# Patient Record
Sex: Female | Born: 1995 | Race: Black or African American | Hispanic: No | Marital: Single | Smoking: Never smoker
Health system: Southern US, Community
[De-identification: ages and names within clinical notes are randomized; demographics above are authoritative.]

## PROBLEM LIST (undated history)

## (undated) DIAGNOSIS — Z789 Other specified health status: Secondary | ICD-10-CM

## (undated) DIAGNOSIS — D649 Anemia, unspecified: Secondary | ICD-10-CM

## (undated) HISTORY — PX: NO PAST SURGERIES: SHX2092

## (undated) NOTE — *Deleted (*Deleted)
Meet the Provider Zoom Sessions      [facility] for [facility] is now offering FREE monthly 1-hour virtual Zoom sessions for new, current, and prospective patients.        During these sessions, you can:   Learn about our practice, model of care, services   Get answers to questions about pregnancy and birth during COVID   Pick your provider's brain about anything else!    Sessions will be hosted by [employer] for [employer], Producer, television/film/video, Physicians and Midwives          No registration required      2021 Dates:      All at 6pm     October 21st     November 18th   December 16th     January 20th  February 17th    To join one of these meetings, a few minutes before it is set to start:     Copy/paste the link into your web browser:  [email or web address]    OR  Scan the QR code below (open up your camera and point towards QR code; click on tab that pops up on your phone ("zoom")     Pregnancy and Anemia  Anemia is a condition in which the concentration of red blood cells, or hemoglobin, in the blood is below normal. Hemoglobin is a substance in red blood cells that carries oxygen to the tissues of the body. Anemia results when enough oxygen does not reach these tissues. Anemia is common during pregnancy because the woman's body needs more blood volume and blood cells to provide nutrition to the fetus. The fetus needs iron and folic acid as it is developing. Your body may not produce enough red blood cells because of this. Also, during pregnancy, the liquid part of the blood (plasma) increases by about 30-50%, and the red blood cells increase by only 20%. This lowers the concentration of the red blood cells and creates a natural anemia-like situation. What are the causes? The most common cause of anemia during pregnancy is not having enough iron in the body to make red  blood cells (iron deficiency anemia). Other causes may include:  Folic acid deficiency.  Vitamin B12 deficiency.  Certain prescription or over-the-counter medicines.  Certain medical conditions or infections that destroy red blood cells.  A low platelet count and bleeding caused by antibodies that go through the placenta to the fetus from the mother's blood. What are the signs or symptoms? Mild anemia may not be noticeable. If it becomes severe, symptoms may include:  Feeling tired (fatigue).  Shortness of breath, especially during activity.  Weakness.  Fainting.  Pale looking skin.  Headaches.  A fast or irregular heartbeat (palpitations).  Dizziness. How is this diagnosed? This condition may be diagnosed based on:  Your medical history and a physical exam.  Blood tests. How is this treated? Treatment for anemia during pregnancy depends on the cause of the anemia. Treatment can include:  Dietary changes.  Supplements of iron, vitamin B12, or folic acid.  A blood transfusion. This may be needed if anemia is severe.  Hospitalization. This may be needed if there is a lot of blood loss or severe anemia. Follow these instructions at home:  Follow recommendations from your dietitian or health care provider about changing your diet.  Increase your vitamin C intake. This will help the stomach absorb more iron. Some foods that are high in vitamin C include: ? Oranges. ? Peppers. ?  Tomatoes. ? Mangoes.  Eat a diet rich in iron. This would include foods such as: ? Liver. ? Beef. ? Eggs. ? Whole grains. ? Spinach. ? Dried fruit.  Take iron and vitamins as told by your health care provider.  Eat green leafy vegetables. These are a good source of folic acid.  Keep all follow-up visits as told by your health care provider. This is important. Contact a health care provider if:  You have frequent or lasting headaches.  You look pale.  You bruise easily. Get  help right away if:  You have extreme weakness, shortness of breath, or chest pain.  You become dizzy or have trouble concentrating.  You have heavy vaginal bleeding.  You develop a rash.  You have bloody or black, tarry stools.  You faint.  You vomit up blood.  You vomit repeatedly.  You have abdominal pain.  You have a fever.  You are dehydrated. Summary  Anemia is a condition in which the concentration of red blood cells or hemoglobin in the blood is below normal.  Anemia is common during pregnancy because the woman's body needs more blood volume and blood cells to provide nutrition to the fetus.  The most common cause of anemia during pregnancy is not having enough iron in the body to make red blood cells (iron deficiency anemia).  Mild anemia may not be noticeable. If it becomes severe, symptoms may include feeling tired and weak. This information is not intended to replace advice given to you by your health care provider. Make sure you discuss any questions you have with your health care provider. Document Revised: 12/10/2018 Document Reviewed: 08/01/2016 Elsevier Patient Education  2020 [employer].

---

## 2018-07-10 NOTE — L&D Delivery Note (Signed)
Patient is a 23 y.o. now G1P1 s/p NSVD at [redacted]w[redacted]d, who was admitted for IOL for PD.  She progressed with augmentation (AROM,Oxytocin) to complete and pushed ~ 2hours to deliver. Head delivered (ROA) then shoulder dystocia noted. McRoberts and Suprapubic performed to deliver shoulders. Bruising noted on left shoulder after delivery. Spontaneous cry after stimulation.  Cord clamping delayed by several minutes then clamped by CNM and cut by FOB.  Placenta intact and spontaneous, bleeding minimal.  Left labial laceration repaired without difficulty.  Mom and baby stable prior to transfer to postpartum. She plans on breastfeeding. She declines method of birth control.  Delivery Note At 4:28 PM a viable and healthy female was delivered via Vaginal, Spontaneous (Presentation: ROA).  APGAR: 7, 9; weight pending .   Placenta intact and spontaneous, bleeding minimal. 3V Cord:  with the following complications: shoulder dystocia.  Cord pH: collected but could not be ran d/t clotting.   Anesthesia:  Epidural Episiotomy: None Lacerations: Labial Suture Repair: 3.0 vicryl Est. Blood Loss (mL):  537mL   Mom to postpartum.  Baby to Couplet care / Skin to Skin.  Lajean Manes CNM 05/03/2019, 5:03 PM

## 2019-03-25 ENCOUNTER — Inpatient Hospital Stay (HOSPITAL_COMMUNITY)
Admission: AD | Admit: 2019-03-25 | Discharge: 2019-03-25 | Disposition: A | Discharge status: Home / Self Care | Payer: Medicaid Other | Attending: Obstetrics and Gynecology | Admitting: Obstetrics and Gynecology

## 2019-03-25 ENCOUNTER — Encounter (HOSPITAL_COMMUNITY): Payer: Self-pay | Admitting: *Deleted

## 2019-03-25 ENCOUNTER — Other Ambulatory Visit: Payer: Self-pay

## 2019-03-25 DIAGNOSIS — O26853 Spotting complicating pregnancy, third trimester: Secondary | ICD-10-CM | POA: Insufficient documentation

## 2019-03-25 DIAGNOSIS — Z3A35 35 weeks gestation of pregnancy: Secondary | ICD-10-CM | POA: Diagnosis not present

## 2019-03-25 DIAGNOSIS — B373 Candidiasis of vulva and vagina: Secondary | ICD-10-CM | POA: Diagnosis not present

## 2019-03-25 DIAGNOSIS — B3731 Acute candidiasis of vulva and vagina: Secondary | ICD-10-CM

## 2019-03-25 DIAGNOSIS — O98813 Other maternal infectious and parasitic diseases complicating pregnancy, third trimester: Secondary | ICD-10-CM | POA: Diagnosis not present

## 2019-03-25 HISTORY — DX: Other specified health status: Z78.9

## 2019-03-25 LAB — WET PREP, GENITAL
Clue Cells Wet Prep HPF POC: NONE SEEN
Sperm: NONE SEEN
Trich, Wet Prep: NONE SEEN
Yeast Wet Prep HPF POC: NONE SEEN

## 2019-03-25 MED ORDER — TERCONAZOLE 0.8 % VA CREA: 1.0000 | TOPICAL_CREAM | Freq: Every day | VAGINAL | 0 refills | Status: AC

## 2019-03-25 NOTE — MAU Provider Note (Signed)
Chief Complaint:  Vaginal Bleeding   First Provider Initiated Contact with Patient 03/25/19 1415     HPI: Susan Bean is a 23 y.o. G1P0 at 2115w5d who presents to maternity admissions reporting vaginal spotting. First noticed today. Saw pink spotting mixed with some vaginal discharge. Did not see blood the last time she used the bathroom. No complications with this pregnancy. [facility]. No recent intercourse or exams. Denies contractions, leakage of fluid or vaginal bleeding. Good fetal movement.   Past Medical History:  Diagnosis Date  . Medical history non-contributory    OB History  Gravida Para Term Preterm AB Living  1            SAB TAB Ectopic Multiple Live Births               # Outcome Date GA Lbr Len/2nd Weight Sex Delivery Anes PTL Lv  1 Current            Past Surgical History:  Procedure Laterality Date  . NO PAST SURGERIES     History reviewed. No pertinent family history. Social History   Tobacco Use  . Smoking status: Never Smoker  . Smokeless tobacco: Never Used  Substance Use Topics  . Alcohol use: Never    Frequency: Never  . Drug use: Never   No Known Allergies Medications Prior to Admission  Medication Sig Dispense Refill Last Dose  . prenatal vitamin w/FE, FA (PRENATAL 1 + 1) 27-1 MG TABS tablet Take 1 tablet by mouth daily at 12 noon.   03/25/2019 at 1000    I have reviewed patient's Past Medical Hx, Surgical Hx, Family Hx, Social Hx, medications and allergies.   ROS:  Review of Systems  Constitutional: Negative.   Gastrointestinal: Negative.   Genitourinary: Positive for vaginal bleeding and vaginal discharge. Negative for dysuria.    Physical Exam   Patient Vitals for the past 24 hrs:  BP Temp Temp src Pulse Resp SpO2 Weight  03/25/19 1341 133/66 98.1 F (36.7 C) Oral (!) 101 18 100 % 110.8 kg    Constitutional: Well-developed, well-nourished female in no acute distress.  Cardiovascular: normal rate & rhythm,  no murmur Respiratory: normal effort, lung sounds clear throughout GI: Abd soft, non-tender, gravid appropriate for gestational age. Pos BS x 4 MS: Extremities nontender, no edema, normal ROM Neurologic: Alert and oriented x 4.  GU:      Pelvic: No blood. Moderate amount of green tinged clumpy discharge.   NST:  Baseline: 135 bpm, Variability: Good {> 6 bpm), Accelerations: Reactive and Decelerations: Absent   Labs: Results for orders placed or performed during the hospital encounter of 03/25/19 (from the past 24 hour(s))  Wet prep, genital     Status: Abnormal   Collection Time: 03/25/19  2:31 PM   Specimen: Cervix  Result Value Ref Range   Yeast Wet Prep HPF POC NONE SEEN NONE SEEN   Trich, Wet Prep NONE SEEN NONE SEEN   Clue Cells Wet Prep HPF POC NONE SEEN NONE SEEN   WBC, Wet Prep HPF POC MODERATE (A) NONE SEEN   Sperm NONE SEEN     Imaging:  No results found.  MAU Course: Orders Placed This Encounter  Procedures  . Wet prep, genital  . Discharge patient   Meds ordered this encounter  Medications  . terconazole (TERAZOL 3) 0.8 % vaginal cream    Sig: Place 1 applicator vaginally at bedtime for 3 days.  Dispense:  20 g    Refill:  0    Order Specific Question:   Supervising Provider    Answer:   CONSTANT, PEGGY [[id number]]    MDM: Reactive NST  On exam, no blood noted. Moderate amount of discharge consistent with yeast infection.   Assessment: 1. Vaginal yeast infection   2. [redacted] weeks gestation of pregnancy     Plan: Discharge home in stable condition.  Discussed reasons to return to MAU Rx terazol  Follow-up Information    Department, [facility] Follow up.   Why: keep follow up appointment Contact information: [place] [address] [phone]        [facility] Follow up.   Specialty: Obstetrics and Gynecology Why: return for worsening symptoms Contact information: [address] [id number] [place] [phone]          Allergies as of 03/25/2019   No Known Allergies     Medication List    TAKE these medications   prenatal vitamin w/FE, FA 27-1 MG Tabs tablet Take 1 tablet by mouth daily at 12 noon.   terconazole 0.8 % vaginal cream Commonly known as: Terazol 3 Place 1 applicator vaginally at bedtime for 3 days.       Susan Guild, NP 03/25/2019 3:08 PM

## 2019-03-25 NOTE — Discharge Instructions (Signed)
Fetal Movement Counts Patient Name: ________________________________________________ Patient Due Date: ____________________ What is a fetal movement count?  A fetal movement count is the number of times that you feel your baby move during a certain amount of time. This may also be called a fetal kick count. A fetal movement count is recommended for every pregnant woman. You may be asked to start counting fetal movements as early as week 28 of your pregnancy. Pay attention to when your baby is most active. You may notice your baby's sleep and wake cycles. You may also notice things that make your baby move more. You should do a fetal movement count:  When your baby is normally most active.  At the same time each day. A good time to count movements is while you are resting, after having something to eat and drink. How do I count fetal movements? 1. Find a quiet, comfortable area. Sit, or lie down on your side. 2. Write down the date, the start time and stop time, and the number of movements that you felt between those two times. Take this information with you to your health care visits. 3. For 2 hours, count kicks, flutters, swishes, rolls, and jabs. You should feel at least 10 movements during 2 hours. 4. You may stop counting after you have felt 10 movements. 5. If you do not feel 10 movements in 2 hours, have something to eat and drink. Then, keep resting and counting for 1 hour. If you feel at least 4 movements during that hour, you may stop counting. Contact a health care provider if:  You feel fewer than 4 movements in 2 hours.  Your baby is not moving like he or she usually does. Date: ____________ Start time: ____________ Stop time: ____________ Movements: ____________ Date: ____________ Start time: ____________ Stop time: ____________ Movements: ____________ Date: ____________ Start time: ____________ Stop time: ____________ Movements: ____________ Date: ____________ Start time:  ____________ Stop time: ____________ Movements: ____________ Date: ____________ Start time: ____________ Stop time: ____________ Movements: ____________ Date: ____________ Start time: ____________ Stop time: ____________ Movements: ____________ Date: ____________ Start time: ____________ Stop time: ____________ Movements: ____________ Date: ____________ Start time: ____________ Stop time: ____________ Movements: ____________ Date: ____________ Start time: ____________ Stop time: ____________ Movements: ____________ This information is not intended to replace advice given to you by your health care provider. Make sure you discuss any questions you have with your health care provider. Document Released: 07/26/2006 Document Revised: 07/16/2018 Document Reviewed: 08/05/2015 Elsevier Patient Education  2020 [place]. Vaginal Yeast infection, Adult  Vaginal yeast infection is a condition that causes vaginal discharge as well as soreness, swelling, and redness (inflammation) of the vagina. This is a common condition. Some women get this infection frequently. What are the causes? This condition is caused by a change in the normal balance of the yeast (candida) and bacteria that live in the vagina. This change causes an overgrowth of yeast, which causes the inflammation. What increases the risk? The condition is more likely to develop in women who:  Take antibiotic medicines.  Have diabetes.  Take birth control pills.  Are pregnant.  Douche often.  Have a weak body defense system (immune system).  Have been taking steroid medicines for a long time.  Frequently wear tight clothing. What are the signs or symptoms? Symptoms of this condition include:  White, thick, creamy vaginal discharge.  Swelling, itching, redness, and irritation of the vagina. The lips of the vagina (vulva) may be affected as well.  Pain or  a burning feeling while urinating.  Pain during sex. How is this  diagnosed? This condition is diagnosed based on:  Your medical history.  A physical exam.  A pelvic exam. Your health care provider will examine a sample of your vaginal discharge under a microscope. Your health care provider may send this sample for testing to confirm the diagnosis. How is this treated? This condition is treated with medicine. Medicines may be over-the-counter or prescription. You may be told to use one or more of the following:  Medicine that is taken by mouth (orally).  Medicine that is applied as a cream (topically).  Medicine that is inserted directly into the vagina (suppository). Follow these instructions at home:  Lifestyle  Do not have sex until your health care provider approves. Tell your sex partner that you have a yeast infection. That person should go to his or her health care provider and ask if they should also be treated.  Do not wear tight clothes, such as pantyhose or tight pants.  Wear breathable cotton underwear. General instructions  Take or apply over-the-counter and prescription medicines only as told by your health care provider.  Eat more yogurt. This may help to keep your yeast infection from returning.  Do not use tampons until your health care provider approves.  Try taking a sitz bath to help with discomfort. This is a warm water bath that is taken while you are sitting down. The water should only come up to your hips and should cover your buttocks. Do this 3-4 times per day or as told by your health care provider.  Do not douche.  If you have diabetes, keep your blood sugar levels under control.  Keep all follow-up visits as told by your health care provider. This is important. Contact a health care provider if:  You have a fever.  Your symptoms go away and then return.  Your symptoms do not get better with treatment.  Your symptoms get worse.  You have new symptoms.  You develop blisters in or around your  vagina.  You have blood coming from your vagina and it is not your menstrual period.  You develop pain in your abdomen. Summary  Vaginal yeast infection is a condition that causes discharge as well as soreness, swelling, and redness (inflammation) of the vagina.  This condition is treated with medicine. Medicines may be over-the-counter or prescription.  Take or apply over-the-counter and prescription medicines only as told by your health care provider.  Do not douche. Do not have sex or use tampons until your health care provider approves.  Contact a health care provider if your symptoms do not get better with treatment or your symptoms go away and then return. This information is not intended to replace advice given to you by your health care provider. Make sure you discuss any questions you have with your health care provider. Document Released: 04/05/2005 Document Revised: 11/12/2017 Document Reviewed: 11/12/2017 Elsevier Patient Education  2020 [employer]

## 2019-03-25 NOTE — MAU Note (Signed)
Went to the bathroom, saw pink spotting when she wiped.  Is due in 3 wks.  Getting care at [facility]. No pain, no leaking.  No problems with preg.

## 2019-05-02 ENCOUNTER — Inpatient Hospital Stay (HOSPITAL_COMMUNITY)
Admission: AD | Admit: 2019-05-02 | Discharge: 2019-05-05 | DRG: 807 | Disposition: A | Discharge status: Home / Self Care | Payer: Medicaid Other | Attending: Obstetrics & Gynecology | Admitting: Obstetrics & Gynecology

## 2019-05-02 ENCOUNTER — Other Ambulatory Visit: Payer: Self-pay

## 2019-05-02 ENCOUNTER — Encounter (HOSPITAL_COMMUNITY): Payer: Self-pay

## 2019-05-02 DIAGNOSIS — Z3A41 41 weeks gestation of pregnancy: Secondary | ICD-10-CM

## 2019-05-02 DIAGNOSIS — Z20828 Contact with and (suspected) exposure to other viral communicable diseases: Secondary | ICD-10-CM | POA: Diagnosis present

## 2019-05-02 DIAGNOSIS — D649 Anemia, unspecified: Secondary | ICD-10-CM | POA: Diagnosis present

## 2019-05-02 DIAGNOSIS — O480 Post-term pregnancy: Principal | ICD-10-CM | POA: Diagnosis present

## 2019-05-02 DIAGNOSIS — O99824 Streptococcus B carrier state complicating childbirth: Secondary | ICD-10-CM | POA: Diagnosis present

## 2019-05-02 DIAGNOSIS — O9902 Anemia complicating childbirth: Secondary | ICD-10-CM | POA: Diagnosis present

## 2019-05-02 LAB — DIFFERENTIAL
Abs Immature Granulocytes: 0.22 10*3/uL — ABNORMAL HIGH (ref 0.00–0.07)
Basophils Absolute: 0 10*3/uL (ref 0.0–0.1)
Basophils Relative: 0 %
Eosinophils Absolute: 0 10*3/uL (ref 0.0–0.5)
Eosinophils Relative: 0 %
Immature Granulocytes: 2 %
Lymphocytes Relative: 17 %
Lymphs Abs: 2.3 10*3/uL (ref 0.7–4.0)
Monocytes Absolute: 1 10*3/uL (ref 0.1–1.0)
Monocytes Relative: 7 %
Neutro Abs: 10 10*3/uL — ABNORMAL HIGH (ref 1.7–7.7)
Neutrophils Relative %: 74 %

## 2019-05-02 LAB — TYPE AND SCREEN
ABO/RH(D): O NEG
Antibody Screen: NEGATIVE

## 2019-05-02 LAB — CBC
HCT: 29.4 % — ABNORMAL LOW (ref 36.0–46.0)
Hemoglobin: 8.3 g/dL — ABNORMAL LOW (ref 12.0–15.0)
MCH: 18.4 pg — ABNORMAL LOW (ref 26.0–34.0)
MCHC: 28.2 g/dL — ABNORMAL LOW (ref 30.0–36.0)
MCV: 65 fL — ABNORMAL LOW (ref 80.0–100.0)
Platelets: 415 10*3/uL — ABNORMAL HIGH (ref 150–400)
RBC: 4.52 MIL/uL (ref 3.87–5.11)
RDW: 20.6 % — ABNORMAL HIGH (ref 11.5–15.5)
WBC: 13.6 10*3/uL — ABNORMAL HIGH (ref 4.0–10.5)
nRBC: 0.5 % — ABNORMAL HIGH (ref 0.0–0.2)

## 2019-05-02 LAB — RAPID HIV SCREEN (HIV 1/2 AB+AG)
HIV 1/2 Antibodies: NONREACTIVE
HIV-1 P24 Antigen - HIV24: NONREACTIVE

## 2019-05-02 LAB — HEPATITIS B SURFACE ANTIGEN: Hepatitis B Surface Ag: NONREACTIVE

## 2019-05-02 LAB — GROUP B STREP BY PCR: Group B strep by PCR: POSITIVE — AB

## 2019-05-02 LAB — ABO/RH: ABO/RH(D): O NEG

## 2019-05-02 MED ORDER — FENTANYL CITRATE (PF) 100 MCG/2ML IJ SOLN
100.0000 ug | INTRAMUSCULAR | Status: DC | PRN
  Administered 2019-05-03 (×3): 100 ug via INTRAVENOUS
  Filled 2019-05-02 (×3): qty 2

## 2019-05-02 MED ORDER — OXYTOCIN 40 UNITS IN NORMAL SALINE INFUSION - SIMPLE MED
2.5000 [IU]/h | INTRAVENOUS | Status: DC
  Administered 2019-05-03: 17:00:00 2.5 [IU]/h via INTRAVENOUS
  Filled 2019-05-02: qty 1000

## 2019-05-02 MED ORDER — SODIUM CHLORIDE 0.9 % IV SOLN
5.0000 10*6.[IU] | Freq: Once | INTRAVENOUS | Status: AC
  Administered 2019-05-02: 5 10*6.[IU] via INTRAVENOUS
  Filled 2019-05-02: qty 5

## 2019-05-02 MED ORDER — OXYCODONE-ACETAMINOPHEN 5-325 MG PO TABS: 1.0000 | ORAL_TABLET | ORAL | Status: DC | PRN

## 2019-05-02 MED ORDER — SOD CITRATE-CITRIC ACID 500-334 MG/5ML PO SOLN: 30.0000 mL | ORAL | Status: DC | PRN

## 2019-05-02 MED ORDER — OXYTOCIN BOLUS FROM INFUSION
500.0000 mL | Freq: Once | INTRAVENOUS | Status: AC
  Administered 2019-05-03: 500 mL via INTRAVENOUS

## 2019-05-02 MED ORDER — ACETAMINOPHEN 325 MG PO TABS: 650.0000 mg | ORAL_TABLET | ORAL | Status: DC | PRN

## 2019-05-02 MED ORDER — PENICILLIN G 3 MILLION UNITS IVPB - SIMPLE MED
3.0000 10*6.[IU] | INTRAVENOUS | Status: DC
  Administered 2019-05-03 (×4): 3 10*6.[IU] via INTRAVENOUS
  Filled 2019-05-02 (×3): qty 100

## 2019-05-02 MED ORDER — ONDANSETRON HCL 4 MG/2ML IJ SOLN: 4.0000 mg | Freq: Four times a day (QID) | INTRAMUSCULAR | Status: DC | PRN

## 2019-05-02 MED ORDER — LACTATED RINGERS IV SOLN: 500.0000 mL | INTRAVENOUS | Status: DC | PRN

## 2019-05-02 MED ORDER — LIDOCAINE HCL (PF) 1 % IJ SOLN: 30.0000 mL | INTRAMUSCULAR | Status: DC | PRN

## 2019-05-02 MED ORDER — LACTATED RINGERS IV SOLN
INTRAVENOUS | Status: DC
  Administered 2019-05-02 – 2019-05-03 (×4): via INTRAVENOUS

## 2019-05-02 MED ORDER — OXYCODONE-ACETAMINOPHEN 5-325 MG PO TABS: 2.0000 | ORAL_TABLET | ORAL | Status: DC | PRN

## 2019-05-02 NOTE — H&P (Signed)
OBSTETRIC ADMISSION HISTORY AND PHYSICAL  Susan Bean is a 23 y.o. female G1P0 with IUP at [redacted]w[redacted]d presenting for SOL. She reports +FMs. No LOF, VB, blurry vision, headaches, peripheral edema, or RUQ pain. She plans on breastfeeding. She requests natural family planning for birth control.  Dating: By LMP --->  Estimated Date of Delivery: 04/24/19  [facility].  Prenatal History/Complications: unknown  Past Medical History: Past Medical History:  Diagnosis Date  . Medical history non-contributory     Past Surgical History: Past Surgical History:  Procedure Laterality Date  . NO PAST SURGERIES      Obstetrical History: OB History    Gravida  1   Para      Term      Preterm      AB      Living        SAB      TAB      Ectopic      Multiple      Live Births              Social History: Social History   Socioeconomic History  . Marital status: Single    Spouse name: Not on file  . Number of children: Not on file  . Years of education: Not on file  . Highest education level: Not on file  Occupational History  . Not on file  Social Needs  . Financial resource strain: Not on file  . Food insecurity    Worry: Not on file    Inability: Not on file  . Transportation needs    Medical: Not on file    Non-medical: Not on file  Tobacco Use  . Smoking status: Never Smoker  . Smokeless tobacco: Never Used  Substance and Sexual Activity  . Alcohol use: Never    Frequency: Never  . Drug use: Never  . Sexual activity: Yes  Lifestyle  . Physical activity    Days per week: Not on file    Minutes per session: Not on file  . Stress: Not on file  Relationships  . Social Musician on phone: Not on file    Gets together: Not on file    Attends religious service: Not on file    Active member of club or organization: Not on file    Attends meetings of clubs or  organizations: Not on file    Relationship status: Not on file  Other Topics Concern  . Not on file  Social History Narrative  . Not on file    Family History: History reviewed. No pertinent family history.  Allergies: No Known Allergies  Medications Prior to Admission  Medication Sig Dispense Refill Last Dose  . prenatal vitamin w/FE, FA (PRENATAL 1 + 1) 27-1 MG TABS tablet Take 1 tablet by mouth daily at 12 noon.   05/02/2019 at Unknown time     Review of Systems   All systems reviewed and negative except as stated in HPI  Blood pressure 121/71, pulse 90, temperature 98.4 F (36.9 C), temperature source Oral, resp. rate 16, height 5\' 7"  (1.702 m), weight 112.6 kg, SpO2 99 %. General appearance: alert, cooperative and appears stated age Lungs: regular rate and effort Heart: regular rate  Abdomen: soft, non-tender Extremities: Homans sign is negative, no sign of DVT Presentation: cephalic Fetal monitoringBaseline: 130 bpm, Variability: Good {> 6 bpm), Accelerations: Reactive  and Decelerations: Absent Uterine activity: CTX q5-6 minutes Dilation: 5 Effacement (%): 90 Station: -2 Exam by:: benji Production designer, theatre/television/film   Prenatal labs: unknown  Prenatal Transfer Tool  Maternal Diabetes: unknown Genetic Screening: unknown Maternal Ultrasounds/Referrals: unknown Fetal Ultrasounds or other Referrals:  unknown Maternal Substance Abuse:  No Significant Maternal Medications:  None Significant Maternal Lab Results: unknown  Results for orders placed or performed during the hospital encounter of 05/02/19 (from the past 24 hour(s))  CBC   Collection Time: 05/02/19  8:41 PM  Result Value Ref Range   WBC 13.6 (H) 4.0 - 10.5 K/uL   RBC 4.52 3.87 - 5.11 MIL/uL   Hemoglobin 8.3 (L) 12.0 - 15.0 g/dL   HCT 29.4 (L) 36.0 - 46.0 %   MCV 65.0 (L) 80.0 - 100.0 fL   MCH 18.4 (L) 26.0 - 34.0 pg   MCHC 28.2 (L) 30.0 - 36.0 g/dL   RDW 20.6 (H) 11.5 - 15.5 %   Platelets 415 (H) 150 - 400 K/uL    nRBC 0.5 (H) 0.0 - 0.2 %  Type and screen [place]   Collection Time: 05/02/19  8:41 PM  Result Value Ref Range   ABO/RH(D) PENDING    Antibody Screen PENDING    Sample Expiration      05/05/2019,2359 Performed at [facility], [address], [place], [place] [address]     There are no active problems to display for this patient.   Assessment: Susan Bean is a 22 y.o. G1P0 at [redacted]w[redacted]d here for SOL  1. Labor: expectant management, consider AROM or pitocin for augmentation if appropriate 2. FWB: Cat 1 tracing 3. Pain: epidural as desired 4. GBS: Positive by PCR, tx with PCN 5. Record unavailable, prenatal panel drawn, GC/CT swab collected   Plan: Anticipate NSVD  Merilyn Baba, DO  05/02/2019, 9:29 PM

## 2019-05-02 NOTE — MAU Note (Signed)
Pt reports mucous plug came out earlier today. States she started contracting before her mucous plug came out. Contractions are irregular and not painful. Reports she has not timed them. Pt denies vaginal bleeding. Reports good fetal movement. Gets [facility] at [place]. Last appointment was at the beginning of October. States she missed her last appointment. Rescheduled but never went because her provider was not in the office.

## 2019-05-03 ENCOUNTER — Encounter (HOSPITAL_COMMUNITY): Payer: Self-pay | Admitting: Anesthesiology

## 2019-05-03 ENCOUNTER — Inpatient Hospital Stay (HOSPITAL_COMMUNITY): Payer: Medicaid Other | Admitting: Anesthesiology

## 2019-05-03 DIAGNOSIS — Z3A41 41 weeks gestation of pregnancy: Secondary | ICD-10-CM

## 2019-05-03 DIAGNOSIS — O480 Post-term pregnancy: Secondary | ICD-10-CM

## 2019-05-03 LAB — SARS CORONAVIRUS 2 BY RT PCR (HOSPITAL ORDER, PERFORMED IN ~~LOC~~ HOSPITAL LAB): SARS Coronavirus 2: NEGATIVE

## 2019-05-03 LAB — RPR: RPR Ser Ql: NONREACTIVE

## 2019-05-03 MED ORDER — PRENATAL MULTIVITAMIN CH
1.0000 | ORAL_TABLET | Freq: Every day | ORAL | Status: DC
  Administered 2019-05-04: 1 via ORAL
  Filled 2019-05-03: qty 1

## 2019-05-03 MED ORDER — ACETAMINOPHEN 325 MG PO TABS: 650.0000 mg | ORAL_TABLET | ORAL | Status: DC | PRN

## 2019-05-03 MED ORDER — IBUPROFEN 600 MG PO TABS
600.0000 mg | ORAL_TABLET | Freq: Four times a day (QID) | ORAL | Status: DC
  Administered 2019-05-03 – 2019-05-05 (×7): 600 mg via ORAL
  Filled 2019-05-03 (×7): qty 1

## 2019-05-03 MED ORDER — TERBUTALINE SULFATE 1 MG/ML IJ SOLN: 0.2500 mg | Freq: Once | INTRAMUSCULAR | Status: DC | PRN

## 2019-05-03 MED ORDER — DIBUCAINE (PERIANAL) 1 % EX OINT: 1.0000 "application " | TOPICAL_OINTMENT | CUTANEOUS | Status: DC | PRN

## 2019-05-03 MED ORDER — EPHEDRINE 5 MG/ML INJ: 10.0000 mg | INTRAVENOUS | Status: DC | PRN

## 2019-05-03 MED ORDER — SENNOSIDES-DOCUSATE SODIUM 8.6-50 MG PO TABS
2.0000 | ORAL_TABLET | ORAL | Status: DC
  Administered 2019-05-04: 2 via ORAL
  Filled 2019-05-03 (×2): qty 2

## 2019-05-03 MED ORDER — ONDANSETRON HCL 4 MG PO TABS: 4.0000 mg | ORAL_TABLET | ORAL | Status: DC | PRN

## 2019-05-03 MED ORDER — OXYTOCIN 40 UNITS IN NORMAL SALINE INFUSION - SIMPLE MED
1.0000 m[IU]/min | INTRAVENOUS | Status: DC
  Administered 2019-05-03: 2 m[IU]/min via INTRAVENOUS

## 2019-05-03 MED ORDER — TETANUS-DIPHTH-ACELL PERTUSSIS 5-2.5-18.5 LF-MCG/0.5 IM SUSP: 0.5000 mL | Freq: Once | INTRAMUSCULAR | Status: DC

## 2019-05-03 MED ORDER — ZOLPIDEM TARTRATE 5 MG PO TABS: 5.0000 mg | ORAL_TABLET | Freq: Every evening | ORAL | Status: DC | PRN

## 2019-05-03 MED ORDER — PHENYLEPHRINE 40 MCG/ML (10ML) SYRINGE FOR IV PUSH (FOR BLOOD PRESSURE SUPPORT): 80.0000 ug | PREFILLED_SYRINGE | INTRAVENOUS | Status: DC | PRN

## 2019-05-03 MED ORDER — ONDANSETRON HCL 4 MG/2ML IJ SOLN: 4.0000 mg | INTRAMUSCULAR | Status: DC | PRN

## 2019-05-03 MED ORDER — DIPHENHYDRAMINE HCL 50 MG/ML IJ SOLN: 12.5000 mg | INTRAMUSCULAR | Status: DC | PRN

## 2019-05-03 MED ORDER — FENTANYL-BUPIVACAINE-NACL 0.5-0.125-0.9 MG/250ML-% EP SOLN
12.0000 mL/h | EPIDURAL | Status: DC | PRN
  Filled 2019-05-03: qty 250

## 2019-05-03 MED ORDER — SIMETHICONE 80 MG PO CHEW: 80.0000 mg | CHEWABLE_TABLET | ORAL | Status: DC | PRN

## 2019-05-03 MED ORDER — DIPHENHYDRAMINE HCL 25 MG PO CAPS: 25.0000 mg | ORAL_CAPSULE | Freq: Four times a day (QID) | ORAL | Status: DC | PRN

## 2019-05-03 MED ORDER — COCONUT OIL OIL: 1.0000 "application " | TOPICAL_OIL | Status: DC | PRN

## 2019-05-03 MED ORDER — LACTATED RINGERS IV SOLN
500.0000 mL | Freq: Once | INTRAVENOUS | Status: AC
  Administered 2019-05-03: 500 mL via INTRAVENOUS

## 2019-05-03 MED ORDER — LIDOCAINE HCL (PF) 1 % IJ SOLN
INTRAMUSCULAR | Status: DC | PRN
  Administered 2019-05-03: 5 mL via EPIDURAL
  Administered 2019-05-03: 7 mL via EPIDURAL

## 2019-05-03 MED ORDER — WITCH HAZEL-GLYCERIN EX PADS: 1.0000 "application " | MEDICATED_PAD | CUTANEOUS | Status: DC | PRN

## 2019-05-03 MED ORDER — BENZOCAINE-MENTHOL 20-0.5 % EX AERO
1.0000 "application " | INHALATION_SPRAY | CUTANEOUS | Status: DC | PRN
  Administered 2019-05-03 – 2019-05-05 (×2): 1 via TOPICAL
  Filled 2019-05-03 (×2): qty 56

## 2019-05-03 MED ORDER — PHENYLEPHRINE 40 MCG/ML (10ML) SYRINGE FOR IV PUSH (FOR BLOOD PRESSURE SUPPORT)
80.0000 ug | PREFILLED_SYRINGE | INTRAVENOUS | Status: DC | PRN
  Filled 2019-05-03: qty 10

## 2019-05-03 MED ORDER — SODIUM CHLORIDE (PF) 0.9 % IJ SOLN
INTRAMUSCULAR | Status: DC | PRN
  Administered 2019-05-03: 12 mL/h via EPIDURAL

## 2019-05-03 NOTE — Progress Notes (Signed)
Labor Progress Note NEESA KNAPIK is a 23 y.o. G1P0 at [redacted]w[redacted]d presented for IOL for postdates  S:  Patient comfortable with epidural  O:  BP 104/78   Pulse 75   Temp 98.1 F (36.7 C) (Oral)   Resp 18   Ht 5\' 7"  (1.702 m)   Wt 112.6 kg   SpO2 99%   BMI 38.87 kg/m   Fetal Tracing:  Baseline: 130 Variability: moderate Accels: 15x15 Decels: none  Toco: 3-5   CVE: Dilation: 8 Effacement (%): 90 Cervical Position: Middle Station: 0 Presentation: Vertex Exam by:: Len Blalock, CNM    A&P: 23 y.o. G1P0 [redacted]w[redacted]d IOL for PD #Labor: Progressing well. Discussed with patient risks and benefits of AROM for augmentation of labor. Patient agreeable to plan of care. AROM with large amount of clear fluid. Patient and FHR tolerated procedure well. Continue to increase pitocin  #Pain: epidural #FWB: Cat 1 #GBS positive  Wende Mott, CNM 11:53 AM

## 2019-05-03 NOTE — Progress Notes (Signed)
Susan Bean is a 23 y.o. G1P0 at [redacted]w[redacted]d admitted for SOL  Subjective: Feeling pressure with contractions, but able to sleep through them with IV pain medication. Feeling baby move, denies vaginal bleeding or LOF.  Objective: BP 102/64   Pulse 80   Temp 98.6 F (37 C) (Oral)   Resp 18   Ht 5\' 7"  (1.702 m)   Wt 112.6 kg   SpO2 99%   BMI 38.87 kg/m  No intake/output data recorded.  FHT:  FHR: 135-140 bpm, variability: moderate,  accelerations:  Present,  decelerations:  Absent UC:   irregular, every 2-5 minutes  SVE:   Dilation: 5 Effacement (%): 80 Station: -2 Exam by:: Franchot Erichsen, RNC  Labs: Lab Results  Component Value Date   WBC 13.6 (H) 05/02/2019   HGB 8.3 (L) 05/02/2019   HCT 29.4 (L) 05/02/2019   MCV 65.0 (L) 05/02/2019   PLT 415 (H) 05/02/2019    Assessment / Plan: Susan Bean a 23 y.o.G1P0 at [redacted]w[redacted]d here for SOL  1. Labor:No real cervical change since last check. Will start pitocin for augmentation. Consider AROM if appropriate 2. FWB:Cat 1 tracing 3. Pain: IV pain meds,epidural as desired 4. ZGY:FVCBSWHQ by PCR, tx with PCN  Anticipate NSVD  Dan Europe Timofey Carandang DO OB Fellow, Faculty Practice 05/03/2019, 5:41 AM

## 2019-05-03 NOTE — Lactation Note (Signed)
This note was copied from a baby's chart. Lactation Consultation Note  Patient Name: Susan Bean Axton WLSLH'T Date: 05/03/2019 Reason for consult: Initial assessment;Primapara;1st time breastfeeding;Term  6 hours old FT female who is being exclusively BF by his mother, she's a P1. She reported (+) breast changes during the pregnancy. Mom has no [facility] records, but per NP note patient stated she sought [facility] at the [facility]. She didn't participate in the [facility] program during the pregnancy, and she has a DEBP at home.  She' already familiar with hand expression and stated that she was able to get some drops of colostrum with the help of her RN but declined assistance at this point, [place] wanted to check if she had flat or semi flat nipples (noted the "1" on latch score" but mom declined any hands on or latch assistance. She told LC that baby just fed for 20 minutes. Asked mom to call for assistance when needed.  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 Reviewed normal newborn behavior and feeding cues.  Feeding plan:  1. Encouraged mom to feed baby STS 8-12 times/24 hours or sooner if feeding cues are present 2. Hand expression and spoon feeding were also encouraged  BF brochure, BF resources and feeding diary were also reviewed. Parents reported all questions and concerns were answered, they're both aware of [place] OP services and will call PRN.  Maternal Data Formula Feeding for Exclusion: No Has patient been taught Hand Expression?: Yes Does the patient have breastfeeding experience prior to this delivery?: No  Feeding Feeding Type: Breast Fed  LATCH Score Latch: Repeated attempts needed to sustain latch, nipple held in mouth throughout feeding, stimulation needed to elicit sucking reflex.  Audible Swallowing: A few with  stimulation  Type of Nipple: Flat(semiflat)  Comfort (Breast/Nipple): Soft / non-tender  Hold (Positioning): Assistance needed to correctly position infant at breast and maintain latch.  LATCH Score: 6  Interventions Interventions: Breast feeding basics reviewed  Lactation Tools Discussed/Used WIC Program: No   Consult Status Consult Status: Follow-up Date: 05/04/19 Follow-up type: In-patient    Dock Baccam Francene Boyers 05/03/2019, 10:46 PM

## 2019-05-03 NOTE — Discharge Summary (Signed)
Postpartum Discharge Summary  Patient Name: Susan Bean DOB: January 22, 1996 MRN: [id number]  Date of admission: 05/02/2019 Delivering Provider: Lajean Manes   Date of discharge: 05/03/2019  Admitting diagnosis: lost mucus plug Intrauterine pregnancy: [redacted]w[redacted]d    Secondary diagnosis:  Active Problems:   Normal labor   SVD (spontaneous vaginal delivery)   Shoulder dystocia during labor and delivery, delivered  Additional problems: none     Discharge diagnosis: Term Pregnancy Delivered                                                                                                Post partum procedures:n/a  Augmentation: AROM and Pitocin  Complications: Shoulder dystocia   Hospital course:  Induction of Labor With Vaginal Delivery   23y.o. yo G1P0 at 480w2das admitted to the hospital 05/02/2019 for induction of labor.  Indication for induction: Postdates.  Patient had an uncomplicated labor course as follows: Membrane Rupture Time/Date: 11:34 AM ,05/03/2019   Intrapartum Procedures: Episiotomy: None [1]                                         Lacerations:  Labial [10]  Patient had delivery of a Viable infant.  Information for the patient's newborn:  LoShravya, Wickwire0[[id number]]Delivery Method: Vaginal, Spontaneous(Filed from Delivery Summary)    05/03/2019  Details of delivery can be found in separate delivery note.  Patient had a routine postpartum course. Patient is discharged home 05/03/19. Delivery time: 4:28 PM    Magnesium Sulfate received: No BMZ received: No Rhophylac:Yes MMR:N/A Transfusion:No  Physical exam  Vitals:   05/03/19 1500 05/03/19 1635 05/03/19 1645 05/03/19 1700  BP: 116/61 106/65 (!) 106/57 110/79  Pulse: 85 87 82 93  Resp: _0 Temp:  99.4 F (37.4 C)    TempSrc:  Oral    SpO2:      Weight:      Height:       General: alert, cooperative and no distress Lochia: appropriate Uterine Fundus: firm Incision: Healing well  with no significant drainage DVT Evaluation: No evidence of DVT seen on physical exam. Labs: Lab Results  Component Value Date   WBC 13.6 (H) 05/02/2019   HGB 8.3 (L) 05/02/2019   HCT 29.4 (L) 05/02/2019   MCV 65.0 (L) 05/02/2019   PLT 415 (H) 05/02/2019   No flowsheet data found.  Discharge instruction: per After Visit Summary and "Baby and Me Booklet".  After visit meds:  Allergies as of 05/05/2019   No Known Allergies     Medication List    TAKE these medications   ibuprofen 600 MG tablet Commonly known as: ADVIL Take 1 tablet (600 mg total) by mouth every 6 (six) hours.   prenatal vitamin w/FE, FA 27-1 MG Tabs tablet Take 1 tablet by mouth daily at 12 noon.       Diet: routine diet  Activity: Advance as tolerated. Pelvic rest for 6 weeks.  Outpatient follow up:[facility] at [facility] department  Follow up Appt:No future appointments. Follow up Visit:   Newborn Data: Live born female  Birth Weight: 8lb 8.3oz APGAR: 7, 9  Newborn Delivery   Time head delivered: 05/03/2019 16:26:00 Birth date/time: 05/03/2019 16:28:00 Delivery type: Vaginal, Spontaneous      Baby Feeding: Bottle Disposition:home with mother   Wende Mott, Mallory Shirk 05/05/19

## 2019-05-03 NOTE — Progress Notes (Signed)
Labor Progress Note Susan Bean is a 23 y.o. G1P0 at [redacted]w[redacted]d presented for IOL for postdates  S:  Patient sleeping  O:  BP 108/64   Pulse 87   Temp 98.6 F (37 C) (Oral)   Resp 18   Ht 5\' 7"  (1.702 m)   Wt 112.6 kg   SpO2 99%   BMI 38.87 kg/m   Fetal Tracing:  Baseline: 135 Variability: moderate Accels: 15x15 Decels: early  Toco: 1-3   CVE: Dilation: 10 Dilation Complete Date: 05/03/19 Dilation Complete Time: 1420 Effacement (%): 100 Cervical Position: Middle Station: Plus 2 Presentation: Vertex Exam by:: Prince Rome, RN    A&P: 23 y.o. G1P0 [redacted]w[redacted]d IOL postdates #Labor: Progressing well. Will start pushing. #Pain: epidural #FWB: Cat 1 #GBS positive  Wende Mott, CNM 2:30 PM

## 2019-05-03 NOTE — Progress Notes (Signed)
Labor Progress Note Susan Bean is a 23 y.o. G1P0 at [redacted]w[redacted]d presented for spontaneous onset of labor  S:  Patient coping well with fentanyl  O:  BP 137/61   Pulse 72   Temp 98.1 F (36.7 C) (Oral)   Resp 16   Ht 5\' 7"  (1.702 m)   Wt 112.6 kg   SpO2 99%   BMI 38.87 kg/m   Fetal Tracing:  Baseline: 125 Variability: moderate Accels: 15x15 Decels: early  Toco: 1-3   CVE: Dilation: 5 Effacement (%): 80 Cervical Position: Middle Station: -2 Presentation: Vertex Exam by:: Franchot Erichsen, RNC   A&P: 23 y.o. G1P0 [redacted]w[redacted]d SOL #Labor: Progressing well. Will continue pitocin #Pain: IV fentanyl #FWB: Cat 1 #GBS positive  Wende Mott, CNM 10:02 AM

## 2019-05-03 NOTE — Progress Notes (Signed)
Susan Bean is a 23 y.o. G1P0 at 105w2d admitted for active labor  Subjective: Feeling more pressure with contractions, asking for IV pain meds. Feeling baby move, denies leakage of fluid.  Objective: BP 96/65   Pulse 87   Temp 98.3 F (36.8 C) (Oral)   Resp 18   Ht 5\' 7"  (1.702 m)   Wt 112.6 kg   SpO2 99%   BMI 38.87 kg/m  No intake/output data recorded.  FHT:  FHR: 130 bpm, variability: moderate,  accelerations:  Present,  decelerations:  Absent UC:   regular, every 4-6 minutes  SVE:   Dilation: 5 Effacement (%): 80 Station: -2 Exam by:: Franchot Erichsen, RNC  Labs: Lab Results  Component Value Date   WBC 13.6 (H) 05/02/2019   HGB 8.3 (L) 05/02/2019   HCT 29.4 (L) 05/02/2019   MCV 65.0 (L) 05/02/2019   PLT 415 (H) 05/02/2019    Assessment / Plan: Susan Bean is a 23 y.o. G1P0 at [redacted]w[redacted]d here for SOL  1. Labor: expectant management, consider AROM or pitocin for augmentation if appropriate 2. FWB: Cat 1 tracing 3. Pain: IV pain meds, epidural as desired 4. GBS: Positive by PCR, tx with PCN  Anticipate NSVD  Branch Pacitti L Jaret Coppedge DO OB Fellow, Faculty Practice 05/03/2019, 2:05 AM

## 2019-05-03 NOTE — Anesthesia Procedure Notes (Signed)
Epidural Patient location during procedure: OB Start time: 05/03/2019 10:51 AM End time: 05/03/2019 10:53 AM  Staffing Anesthesiologist: Lyn Hollingshead, MD Performed: anesthesiologist   Preanesthetic Checklist Completed: patient identified, site marked, surgical consent, pre-op evaluation, timeout performed, IV checked, risks and benefits discussed and monitors and equipment checked  Epidural Patient position: sitting Prep: site prepped and draped and DuraPrep Patient monitoring: continuous pulse ox and blood pressure Approach: midline Location: L3-L4 Injection technique: LOR air  Needle:  Needle type: Tuohy  Needle gauge: 17 G Needle length: 9 cm and 9 Needle insertion depth: 9 cm Catheter type: closed end flexible Catheter size: 19 Gauge Catheter at skin depth: 14 cm Test dose: negative and Other  Assessment Events: blood not aspirated, injection not painful, no injection resistance, negative IV test and no paresthesia  Additional Notes Reason for block:procedure for pain

## 2019-05-03 NOTE — Anesthesia Preprocedure Evaluation (Signed)
Anesthesia Evaluation  Patient identified by MRN, date of birth, ID band Patient awake    Reviewed: Allergy & Precautions, H&P , NPO status , Patient's Chart, lab work & pertinent test results  Airway Mallampati: II  TM Distance: >3 FB Neck ROM: full    Dental no notable dental hx. (+) Teeth Intact   Pulmonary neg pulmonary ROS,    Pulmonary exam normal breath sounds clear to auscultation       Cardiovascular negative cardio ROS Normal cardiovascular exam Rhythm:regular Rate:Normal     Neuro/Psych negative neurological ROS  negative psych ROS   GI/Hepatic negative GI ROS, Neg liver ROS,   Endo/Other  negative endocrine ROS  Renal/GU negative Renal ROS  negative genitourinary   Musculoskeletal   Abdominal (+) + obese,   Peds  Hematology negative hematology ROS (+) anemia ,   Anesthesia Other Findings   Reproductive/Obstetrics (+) Pregnancy                             Anesthesia Physical Anesthesia Plan  ASA: II  Anesthesia Plan: Epidural   Post-op Pain Management:    Induction:   PONV Risk Score and Plan:   Airway Management Planned:   Additional Equipment:   Intra-op Plan:   Post-operative Plan:   Informed Consent: I have reviewed the patients History and Physical, chart, labs and discussed the procedure including the risks, benefits and alternatives for the proposed anesthesia with the patient or authorized representative who has indicated his/her understanding and acceptance.       Plan Discussed with:   Anesthesia Plan Comments:         Anesthesia Quick Evaluation

## 2019-05-04 LAB — RUBELLA SCREEN: Rubella: 1.57 index (ref >0.99)

## 2019-05-04 MED ORDER — RHO D IMMUNE GLOBULIN 1500 UNIT/2ML IJ SOSY
300.0000 ug | PREFILLED_SYRINGE | Freq: Once | INTRAMUSCULAR | Status: AC
  Administered 2019-05-04: 300 ug via INTRAVENOUS
  Filled 2019-05-04: qty 2

## 2019-05-04 NOTE — Progress Notes (Signed)
POSTPARTUM PROGRESS NOTE  Subjective: Susan Bean is a 23 y.o. G1P1001 s/p augmented SVD at [redacted]w[redacted]d, complicated by 7D42A shoulder dystocia.  She reports she doing well. No acute events overnight. She denies any problems with ambulating, voiding or po intake. Denies nausea or vomiting. She has passed flatus. Pain is well controlled.  Lochia is appropriate.  Objective: Blood pressure 108/68, pulse 68, temperature 98.2 F (36.8 C), temperature source Oral, resp. rate 16, height 5\' 7"  (1.702 m), weight 112.6 kg, SpO2 100 %, unknown if currently breastfeeding.  Physical Exam:  General: alert, cooperative and no distress Chest: no respiratory distress Abdomen: soft, non-tender  Uterine Fundus: firm, appropriately tender Extremities: No calf swelling or tenderness  No edema  Recent Labs    05/02/19 2041  HGB 8.3*  HCT 29.4*    Assessment/Plan: Susan Bean is a 23 y.o. G1P1001 s/p augmented SVD at [redacted]w[redacted]d, complicated by 7G81L shoulder dystocia.  Routine Postpartum Care: Doing well, pain well-controlled.  -- Continue routine care, lactation support  -- Contraception: declines -- Feeding: both  Dispo: Plan for discharge tomorrow.  Merilyn Baba, DO OB/GYN Fellow, [facility] for [facility]

## 2019-05-04 NOTE — Lactation Note (Signed)
This note was copied from a baby's chart. Lactation Consultation Note  Patient Name: Susan Bean [id number] Date: 05/04/2019 Reason for consult: Follow-up assessment;Primapara;1st time breastfeeding;Term  LC followed up with Ms. Westerfield to see how breastfeeding is going and whether Ms. Empson had any questions. Upon LC's entrance to the room, baby was laying in bassinet and was asleep. Ms. Acero reported having just fed him at 7:35pm and prior to that at 6:30pm. She reports that today she alternated between breast and bottle feeding. Ms. Pennix reported that breastfeeding was going well and that she thought baby preferred breast to bottle. [place] educated that in alignment with Ms. Choate goal of breastfeeding more than bottle feeding, she should prioritize breastfeeding if this is her goal.    LC provided education on baby belly size, normal newborn needs, supply and demand relationship of breast milk. Ms. Winterhalter reported having received education on hand expression.   Support person had questions regarding normal teperature of baby and whether it would be okay to have baby sleep on chest. LC advised about alertness required to hold baby and encouraged skin to skin.  Ms. Cerrito is aware of outpatient services and [facility] for further support. Day 2 infant feeding patterns reviewed. LC recommended breastfeeding on demand and using bottles only if necessary.  Parents are aware that [facility] will be available to provide support, upon request - instructed to call if needed. Parents reported all questions were answered.    Maternal Data Has patient been taught Hand Expression?: Yes Does the patient have breastfeeding experience prior to this delivery?: No  Feeding Feeding Type: Breast Fed   Interventions Interventions: Breast feeding basics reviewed   Consult Status Consult Status: Follow-up Date: 05/05/19 Follow-up type: In-patient    Lenore Manner 05/04/2019, 9:05  PM

## 2019-05-04 NOTE — Anesthesia Postprocedure Evaluation (Signed)
Anesthesia Post Note  Patient: Susan Bean  Procedure(s) Performed: AN AD [place]     Patient location during evaluation: Mother Baby Anesthesia Type: Epidural Level of consciousness: awake and alert Pain management: pain level controlled Vital Signs Assessment: post-procedure vital signs reviewed and stable Respiratory status: spontaneous breathing, nonlabored ventilation and respiratory function stable Cardiovascular status: stable Postop Assessment: no headache, no backache and epidural receding Anesthetic complications: no    Last Vitals:  Vitals:   05/04/19 0011 05/04/19 0558  BP: 100/67 108/68  Pulse: (!) 102 68  Resp: 17 16  Temp: 36.8 C 36.8 C  SpO2:      Last Pain:  Vitals:   05/04/19 0729  TempSrc:   PainSc: 0-No pain   Pain Goal:                   Rayvon Char

## 2019-05-05 LAB — RH IG WORKUP (INCLUDES ABO/RH)
ABO/RH(D): O NEG
Fetal Screen: NEGATIVE
Gestational Age(Wks): 41.2
Unit division: 0

## 2019-05-05 MED ORDER — IBUPROFEN 600 MG PO TABS: 600.0000 mg | ORAL_TABLET | Freq: Four times a day (QID) | ORAL | 0 refills | Status: DC

## 2019-05-05 NOTE — Clinical Social Work Maternal (Signed)
CLINICAL SOCIAL WORK MATERNAL/CHILD NOTE  Patient Details  Name: Susan Bean MRN: [id number] Date of Birth: [birth year]  Date:  05/05/2019  Clinical Social Worker Initiating Note:  Hortencia Pilar, LCSW Date/Time: Initiated:  05/05/19/0845     Child's Name:  Susan Bean   Biological Parents:  Mother, Father   Need for Interpreter:  None   Reason for Referral:  Late or No Prenatal Care    Address:  [address] [name] [address]    Phone number:  [phone] (home)     Additional phone number: none   Household Members/Support Persons (HM/SP):   Household Member/Support Person 2   HM/SP Name Relationship DOB or Age  HM/SP -1  Susan Bean   FOB     HM/SP -2 Susan Bean MOB   23  HM/SP -3        HM/SP -4        HM/SP -5        HM/SP -6        HM/SP -7        HM/SP -8          Natural Supports (not living in the home):      Professional Supports:  none    Employment:  none    Type of Work:  none    Education:  High school graduate   Homebound arranged:  n/a  Surveyor, quantity Resources:  Medicaid   Other Resources:  Sales executive , WIC(plans to apply for [facility].)   Cultural/Religious Considerations Which May Impact Care:  none reported.   Strengths:  Ability to meet basic needs , Pediatrician chosen, Compliance with medical plan , Home prepared for child    Psychotropic Medications:    none reported.      Pediatrician:    [place]  Pediatrician List:   [facility] ([address] [employer])  [place]    [place]      Pediatrician Fax Number:    Risk Factors/Current Problems:  None   Cognitive State:  Able to Concentrate , Alert , Insightful    Mood/Affect:  Happy , Interested , Comfortable , Calm , Relaxed    CSW Assessment: CSW consulted as staff unable to locate [facility] records for MOB. CSW went to speak with MOB at  beside. Upon entering the room CSW congratulated MOB and FOB on the birth of infant. CSW advised MOB of the HIPPA policy and MOB reported that it was okay for FOB to remain in the room while CSW spoke with her. CSW understanding and proceeded with speaking with MOB. CSW advised MOB of the reason for CSW coming to visit with her as well as CSW's role. MOB reported that she did get some PNC at [facility], however MOB unsure of the number of visits that she had. MOB reported that she does recall that her first OB visit was around the start part of October. MOB expressed that she was scheduled to have more appointments after this visit but didn't go. CSW understanding and advised MOB of the hospital drug screen policy. CSW advised MOB that UDS is unable to be collected as infant is over 63 hours old. CSW advised MOB that CDS would also be done to detect any substances in infants system. CSW informed MOB that if infants CDS is positive for any substances that she  wasn't give here or prescribed a MD then a CPS report would need to be made.  MOB reported understanding and reported to CSW that she only took her prenatal vitamins and no other substances.  CSW inquired from [facility] on her mental health history. MOB denies having any mental health diagnosis and reports that she has been feeling fine. MOB reports that she is not feeling SI or HI. MOB reported that she has support from family and friends (her cousins). MOB plans for infant to get follow up care at [facility]. MOB took [place] and scored 0 on it with no concerns to CSW. CSW still provided MOB with PPD and SIDS education. MOB and FOB both appeared to have some understanding of SIDS and the importance of making sure that infant is in own sleeping space. MOB was also given PPD Checklist in order to keep track of her feelings as they related to PPD. MOB expressed that she has all needed items to care for infant with no further needs.  CSW offered MOB CC4C and Health Start referral and MOB declined both at this time.    CSW will continue to follow infants CDS to make CPS report if warranted.    CSW Plan/Description:  No Further Intervention Required/No Barriers to Discharge, Sudden Infant Death Syndrome (SIDS) Education, Perinatal Mood and Anxiety Disorder (PMADs) Education, [place], CSW Will Continue to Monitor Umbilical Cord Tissue Drug Screen Results and Make Report if Mardene Sayer, [place] 05/05/2019, 9:25 AM

## 2019-05-05 NOTE — Lactation Note (Signed)
This note was copied from a baby's chart. Lactation Consultation Note  Patient Name: Susan Bean [id number]'A Date: 05/05/2019 Reason for consult: Follow-up assessment  P1 mother whose infant is now 71 hours old.  Mother has been breast/bottle feeding.  When I arrived mother was feeding formula.  She stated that baby "just finished breast feeding" but was still hungry so she supplemented with formula.  Mother had no questions/concerns related to breast feeding.  She has no pain with latching.  Mother's breasts are large, soft and non tender and nipples are everted and intact.  Explained the importance of putting baby to breast first with every feeding prior to supplementing with formula to help encourage a good milk supply.  Mother verbalized understanding.  Continue to encourage breast feeding and every time he is hungry I suggested latching to the breast.    Engorgement prevention/treatment reviewed.  Manual pump provided.  Mother familiar with this pump and has seen it used by a family member.  She did not wish to review pump set up.  She has our OP phone number for questions after discharge.  Father present.   Maternal Data    Feeding Feeding Type: Bottle Fed - Formula Nipple Type: Slow - flow  LATCH Score                   Interventions    Lactation Tools Discussed/Used     Consult Status Consult Status: Complete Date: 05/05/19 Follow-up type: Call as needed    Susan Bean Susan Bean 05/05/2019, 9:57 AM

## 2019-05-05 NOTE — Discharge Instructions (Signed)

## 2019-05-06 LAB — GC/CHLAMYDIA PROBE AMP (~~LOC~~) NOT AT ARMC
Chlamydia: POSITIVE — AB
Comment: NEGATIVE
Comment: NORMAL
Neisseria Gonorrhea: NEGATIVE

## 2019-05-07 ENCOUNTER — Telehealth: Payer: Self-pay | Admitting: Student

## 2019-05-07 ENCOUNTER — Other Ambulatory Visit: Payer: Self-pay | Admitting: Student

## 2019-05-07 DIAGNOSIS — Z8759 Personal history of other complications of pregnancy, childbirth and the puerperium: Secondary | ICD-10-CM | POA: Insufficient documentation

## 2019-05-07 DIAGNOSIS — A749 Chlamydial infection, unspecified: Secondary | ICD-10-CM

## 2019-05-07 MED ORDER — AZITHROMYCIN 250 MG PO TABS: 1000.0000 mg | ORAL_TABLET | Freq: Once | ORAL | 0 refills | Status: AC

## 2019-05-07 NOTE — Telephone Encounter (Signed)
Called and spoke to patient; notified her of the positive chlamydia and that she should pick up her medicine. Also notified her that she should notify her child's pediatrician about her positive chlamydia test so that she can be followed up appropriately.   Susan Bean

## 2020-04-21 DIAGNOSIS — Z3480 Encounter for supervision of other normal pregnancy, unspecified trimester: Secondary | ICD-10-CM | POA: Insufficient documentation

## 2020-04-22 ENCOUNTER — Ambulatory Visit (INDEPENDENT_AMBULATORY_CARE_PROVIDER_SITE_OTHER): Payer: Medicaid Other

## 2020-04-22 ENCOUNTER — Other Ambulatory Visit: Payer: Self-pay

## 2020-04-22 DIAGNOSIS — Z3480 Encounter for supervision of other normal pregnancy, unspecified trimester: Secondary | ICD-10-CM

## 2020-04-22 DIAGNOSIS — Z3482 Encounter for supervision of other normal pregnancy, second trimester: Secondary | ICD-10-CM

## 2020-04-22 DIAGNOSIS — Z3A16 16 weeks gestation of pregnancy: Secondary | ICD-10-CM

## 2020-04-22 MED ORDER — BLOOD PRESSURE KIT DEVI: 1.0000 | 0 refills | Status: DC

## 2020-04-22 NOTE — Progress Notes (Addendum)
  Virtual Visit via Telephone Note  I connected with Susan Bean on 04/22/20 at  2:00 PM EDT by telephone and verified that I am speaking with the correct person using two identifiers.  Location: Patient: WORK Provider: [facility]   I discussed the limitations, risks, security and privacy concerns of performing an evaluation and management service by telephone and the availability of in person appointments. I also discussed with the patient that there may be a patient responsible charge related to this service. The patient expressed understanding and agreed to proceed.   History of Present Illness: PRENATAL INTAKE SUMMARY  Susan Bean presents today New OB Nurse Interview.  OB History    Gravida  2   Para  1   Term  1   Preterm      AB      Living  1     SAB      TAB      Ectopic      Multiple  0   Live Births  1          I have reviewed the patient's medical, obstetrical, social, and family histories, medications, and available lab results.  SUBJECTIVE She has no unusual complaints and complains of nausea without vomiting for 5 days   Observations/Objective: Initial nurse interview for history/labs (New OB)  EDD: 10/03/2020 GA: [redacted]w[redacted]d G2 P1   GENERAL APPEARANCE: oriented to person, place and time  Assessment and Plan: Normal pregnancy Prenatal care OB Pnl/HIV labs will be done at NOB visit PHQ-9=0 Pregnancy Risk Screening done BP Cuff ordered, patient will pick up and take to NOB Appt.  Follow Up Instructions:   I discussed the assessment and treatment plan with the patient. The patient was provided an opportunity to ask questions and all were answered. The patient agreed with the plan and demonstrated an understanding of the instructions.   The patient was advised to call back or seek an in-person evaluation if the symptoms worsen or if the condition fails to improve as anticipated.  I provided 15 minutes of non-face-to-face time during this  encounter.   Maretta Bees, RMA   Patient was assessed and managed by nursing staff during this encounter. I have reviewed the chart and agree with the documentation and plan. I have also made any necessary editorial changes.  Coral Ceo, MD 04/23/2020 12:15 PM

## 2020-04-27 ENCOUNTER — Ambulatory Visit (INDEPENDENT_AMBULATORY_CARE_PROVIDER_SITE_OTHER): Payer: Medicaid Other | Admitting: Obstetrics

## 2020-04-27 ENCOUNTER — Encounter: Payer: Self-pay | Admitting: Obstetrics

## 2020-04-27 ENCOUNTER — Other Ambulatory Visit (HOSPITAL_COMMUNITY)
Admission: RE | Admit: 2020-04-27 | Discharge: 2020-04-27 | Disposition: A | Discharge status: Home / Self Care | Payer: Medicaid Other | Source: Ambulatory Visit | Attending: Obstetrics | Admitting: Obstetrics

## 2020-04-27 ENCOUNTER — Other Ambulatory Visit: Payer: Self-pay

## 2020-04-27 VITALS — BP 122/78 | HR 113 | Wt 247.0 lb

## 2020-04-27 DIAGNOSIS — Z8619 Personal history of other infectious and parasitic diseases: Secondary | ICD-10-CM | POA: Diagnosis not present

## 2020-04-27 DIAGNOSIS — Z3480 Encounter for supervision of other normal pregnancy, unspecified trimester: Secondary | ICD-10-CM | POA: Diagnosis not present

## 2020-04-27 DIAGNOSIS — Z3A15 15 weeks gestation of pregnancy: Secondary | ICD-10-CM | POA: Diagnosis not present

## 2020-04-27 DIAGNOSIS — Z3482 Encounter for supervision of other normal pregnancy, second trimester: Secondary | ICD-10-CM

## 2020-04-27 DIAGNOSIS — E669 Obesity, unspecified: Secondary | ICD-10-CM | POA: Diagnosis not present

## 2020-04-27 NOTE — Progress Notes (Signed)
Subjective:    Susan Bean is being seen today for her first obstetrical visit.  This is not a planned pregnancy. She is at [redacted]w[redacted]d gestation. Her obstetrical history is significant for obesity. Relationship with FOB: significant other, not living together. Patient does intend to breast feed. Pregnancy history fully reviewed.  The information documented in the HPI was reviewed and verified.  Menstrual History: OB History    Gravida  2   Para  1   Term  1   Preterm      AB      Living  1     SAB      TAB      Ectopic      Multiple  0   Live Births  1            Patient's last menstrual period was 12/28/2019 (exact date).    Past Medical History:  Diagnosis Date  . Medical history non-contributory     Past Surgical History:  Procedure Laterality Date  . NO PAST SURGERIES      (Not in a hospital admission)  No Known Allergies  Social History   Tobacco Use  . Smoking status: Never Smoker  . Smokeless tobacco: Never Used  Substance Use Topics  . Alcohol use: Never    Family History  Problem Relation Age of Onset  . Cancer Mother   . Arthritis Mother   . Diabetes Maternal Aunt   . Hypertension Maternal Aunt   . Arthritis Maternal Uncle   . Hypertension Maternal Uncle      Review of Systems Constitutional: negative for weight loss Gastrointestinal: negative for vomiting Genitourinary:negative for genital lesions and vaginal discharge and dysuria Musculoskeletal:negative for back pain Behavioral/Psych: negative for abusive relationship, depression, illegal drug usage and tobacco use    Objective:    BP 122/78   Pulse (!) 113   Wt 247 lb (112 kg)   LMP 12/28/2019 (Exact Date)   BMI 38.69 kg/m  General Appearance:    Alert, cooperative, no distress, appears stated age  Head:    Normocephalic, without obvious abnormality, atraumatic  Eyes:    PERRL, conjunctiva/corneas clear, EOM's intact, fundi    benign, both eyes  Ears:    Normal TM's  and external ear canals, both ears  Nose:   Nares normal, septum midline, mucosa normal, no drainage    or sinus tenderness  Throat:   Lips, mucosa, and tongue normal; teeth and gums normal  Neck:   Supple, symmetrical, trachea midline, no adenopathy;    thyroid:  no enlargement/tenderness/nodules; no carotid   bruit or JVD  Back:     Symmetric, no curvature, ROM normal, no CVA tenderness  Lungs:     Clear to auscultation bilaterally, respirations unlabored  Chest Wall:    No tenderness or deformity   Heart:    Regular rate and rhythm, S1 and S2 normal, no murmur, rub   or gallop  Breast Exam:    No tenderness, masses, or nipple abnormality  Abdomen:     Soft, non-tender, bowel sounds active all four quadrants,    no masses, no organomegaly  Genitalia:    Normal female without lesion, discharge or tenderness  Extremities:   Extremities normal, atraumatic, no cyanosis or edema  Pulses:   2+ and symmetric all extremities  Skin:   Skin color, texture, turgor normal, no rashes or lesions  Lymph nodes:   Cervical, supraclavicular, and axillary nodes normal  Neurologic:  CNII-XII intact, normal strength, sensation and reflexes    throughout      Lab Review Urine pregnancy test Labs reviewed yes Radiologic studies reviewed no  Assessment:    Pregnancy at [redacted]w[redacted]d weeks    Plan:   1. Supervision of other normal pregnancy, antepartum Rx: - Culture, OB Urine - TSH - Genetic Screening; Future - CBC/D/Plt+RPR+Rh+ABO+Rub Ab... - AFP, Serum, Open Spina Bifida - Cytology - PAP( [place]) - Cervicovaginal ancillary only( ) - [place] MFM OB COMP + 14 WK; Future  2. History of chlamydia.  Treated.  No TOC done. - Chlamydia TOC done today  3. Obesity (BMI 35.0-39.9 without comorbidity)   Prenatal vitamins.  Counseling provided regarding continued use of seat belts, cessation of alcohol consumption, smoking or use of illicit drugs; infection precautions i.e., influenza/TDAP  immunizations, toxoplasmosis,CMV, parvovirus, listeria and varicella; workplace safety, exercise during pregnancy; routine dental care, safe medications, sexual activity, hot tubs, saunas, pools, travel, caffeine use, fish and methlymercury, potential toxins, hair treatments, varicose veins Weight gain recommendations per IOM guidelines reviewed: underweight/BMI< 18.5--> gain 28 - 40 lbs; normal weight/BMI 18.5 - 24.9--> gain 25 - 35 lbs; overweight/BMI 25 - 29.9--> gain 15 - 25 lbs; obese/BMI >30->gain  11 - 20 lbs Problem list reviewed and updated. FIRST/CF mutation testing/NIPT/QUAD SCREEN/fragile X/Ashkenazi Jewish population testing/Spinal muscular atrophy discussed: requested. Role of ultrasound in pregnancy discussed; fetal survey: requested. Amniocentesis discussed: not indicated.  Follow up in 4 weeks. 50% of 20 min visit spent on counseling and coordination of care.    Brock Bad, MD 04/27/2020 1:39 PM

## 2020-04-28 LAB — CERVICOVAGINAL ANCILLARY ONLY
Bacterial Vaginitis (gardnerella): NEGATIVE
Candida Glabrata: NEGATIVE
Candida Vaginitis: NEGATIVE
Chlamydia: POSITIVE — AB
Comment: NEGATIVE
Comment: NEGATIVE
Comment: NEGATIVE
Comment: NEGATIVE
Comment: NEGATIVE
Comment: NORMAL
Neisseria Gonorrhea: NEGATIVE
Trichomonas: NEGATIVE

## 2020-04-29 ENCOUNTER — Telehealth: Payer: Self-pay

## 2020-04-29 ENCOUNTER — Other Ambulatory Visit: Payer: Self-pay | Admitting: Obstetrics

## 2020-04-29 DIAGNOSIS — A749 Chlamydial infection, unspecified: Secondary | ICD-10-CM

## 2020-04-29 LAB — TSH: TSH: 1.22 u[IU]/mL (ref 0.450–4.500)

## 2020-04-29 LAB — CBC/D/PLT+RPR+RH+ABO+RUB AB...
Antibody Screen: NEGATIVE
Basophils Absolute: 0 10*3/uL (ref 0.0–0.2)
Basos: 0 %
EOS (ABSOLUTE): 0.1 10*3/uL (ref 0.0–0.4)
Eos: 1 %
HCV Ab: <0.1 s/co ratio (ref 0.0–0.9)
HIV Screen 4th Generation wRfx: NONREACTIVE
Hematocrit: 28.9 % — ABNORMAL LOW (ref 34.0–46.6)
Hemoglobin: 8.1 g/dL — ABNORMAL LOW (ref 11.1–15.9)
Hepatitis B Surface Ag: NEGATIVE
Immature Grans (Abs): 0 10*3/uL (ref 0.0–0.1)
Immature Granulocytes: 0 %
Lymphocytes Absolute: 2.4 10*3/uL (ref 0.7–3.1)
Lymphs: 24 %
MCH: 17.4 pg — ABNORMAL LOW (ref 26.6–33.0)
MCHC: 28 g/dL — ABNORMAL LOW (ref 31.5–35.7)
MCV: 62 fL — ABNORMAL LOW (ref 79–97)
Monocytes Absolute: 0.7 10*3/uL (ref 0.1–0.9)
Monocytes: 7 %
Neutrophils Absolute: 6.9 10*3/uL (ref 1.4–7.0)
Neutrophils: 68 %
Platelets: 469 10*3/uL — ABNORMAL HIGH (ref 150–450)
RBC: 4.65 x10E6/uL (ref 3.77–5.28)
RDW: 20.1 % — ABNORMAL HIGH (ref 11.7–15.4)
RPR Ser Ql: NONREACTIVE
Rh Factor: NEGATIVE
Rubella Antibodies, IGG: 1.6 index (ref >0.99)
WBC: 10.2 10*3/uL (ref 3.4–10.8)

## 2020-04-29 LAB — AFP, SERUM, OPEN SPINA BIFIDA
AFP MoM: 0.5
AFP Value: 16.4 ng/mL
Gest. Age on Collection Date: 17.3 weeks
Maternal Age At EDD: 25.2 yr
OSBR Risk 1 IN: 10000
Test Results:: NEGATIVE
Weight: 247 [lb_av]

## 2020-04-29 LAB — HCV INTERPRETATION

## 2020-04-29 LAB — CYTOLOGY - PAP: Diagnosis: NEGATIVE

## 2020-04-29 MED ORDER — AZITHROMYCIN 500 MG PO TABS: 1000.0000 mg | ORAL_TABLET | Freq: Once | ORAL | 0 refills | Status: AC

## 2020-04-29 NOTE — Telephone Encounter (Signed)
-----   Message from Brock Bad, MD sent at 04/29/2020  8:42 AM EDT ----- Azithromycin Rx for BV

## 2020-04-29 NOTE — Telephone Encounter (Signed)
Pt notified of +CT results and voiced understanding Refrain from unprotected intercourse  Partner needs tx Will need TOC Form faxed to [facility].

## 2020-04-30 ENCOUNTER — Other Ambulatory Visit: Payer: Self-pay | Admitting: Obstetrics

## 2020-04-30 DIAGNOSIS — D509 Iron deficiency anemia, unspecified: Secondary | ICD-10-CM

## 2020-04-30 DIAGNOSIS — O2340 Unspecified infection of urinary tract in pregnancy, unspecified trimester: Secondary | ICD-10-CM

## 2020-04-30 LAB — URINE CULTURE, OB REFLEX

## 2020-04-30 LAB — CULTURE, OB URINE

## 2020-04-30 MED ORDER — FERROUS SULFATE 325 (65 FE) MG PO TABS: 325.0000 mg | ORAL_TABLET | Freq: Two times a day (BID) | ORAL | 5 refills | Status: DC

## 2020-04-30 MED ORDER — CEFUROXIME AXETIL 500 MG PO TABS: 500.0000 mg | ORAL_TABLET | Freq: Two times a day (BID) | ORAL | 0 refills | Status: DC

## 2020-05-06 DIAGNOSIS — O2340 Unspecified infection of urinary tract in pregnancy, unspecified trimester: Secondary | ICD-10-CM

## 2020-05-10 ENCOUNTER — Encounter: Payer: Self-pay | Admitting: Obstetrics and Gynecology

## 2020-05-13 MED ORDER — CEFUROXIME AXETIL 500 MG PO TABS: 500.0000 mg | ORAL_TABLET | Freq: Two times a day (BID) | ORAL | 0 refills | Status: DC

## 2020-05-14 ENCOUNTER — Ambulatory Visit: Payer: Medicaid Other | Attending: Obstetrics

## 2020-05-14 ENCOUNTER — Other Ambulatory Visit: Payer: Self-pay | Admitting: *Deleted

## 2020-05-14 ENCOUNTER — Other Ambulatory Visit: Payer: Self-pay

## 2020-05-14 ENCOUNTER — Other Ambulatory Visit: Payer: Self-pay | Admitting: Obstetrics

## 2020-05-14 DIAGNOSIS — O99210 Obesity complicating pregnancy, unspecified trimester: Secondary | ICD-10-CM

## 2020-05-14 DIAGNOSIS — Z3480 Encounter for supervision of other normal pregnancy, unspecified trimester: Secondary | ICD-10-CM

## 2020-05-25 ENCOUNTER — Encounter: Payer: Medicaid Other | Admitting: Advanced Practice Midwife

## 2020-05-25 NOTE — Progress Notes (Deleted)
° °  PRENATAL VISIT NOTE  Subjective:  Susan Bean is a 24 y.o. G2P1001 at [redacted]w[redacted]d being seen today for ongoing prenatal care.  She is currently monitored for the following issues for this {Blank single:19197::"high-risk","low-risk"} pregnancy and has Normal labor; SVD (spontaneous vaginal delivery); Shoulder dystocia during labor and delivery, delivered; Chlamydia infection affecting pregnancy in third trimester; and Supervision of other normal pregnancy, antepartum on their problem list.  Patient reports {sx:14538}.   .  .   . Denies leaking of fluid.   The following portions of the patient's history were reviewed and updated as appropriate: allergies, current medications, past family history, past medical history, past social history, past surgical history and problem list.   Objective:  There were no vitals filed for this visit.  Fetal Status:           General:  Alert, oriented and cooperative. Patient is in no acute distress.  Skin: Skin is warm and dry. No rash noted.   Cardiovascular: Normal heart rate noted  Respiratory: Normal respiratory effort, no problems with respiration noted  Abdomen: Soft, gravid, appropriate for gestational age.        Pelvic: {Blank single:19197::"Cervical exam performed in the presence of a chaperone","Cervical exam deferred"}        Extremities: Normal range of motion.     Mental Status: Normal mood and affect. Normal behavior. Normal judgment and thought content.   Assessment and Plan:  Pregnancy: G2P1001 at [redacted]w[redacted]d 1. [redacted] weeks gestation of pregnancy ***  2. Supervision of other normal pregnancy, antepartum ***  3. Antepartum anemia ***  4. Chlamydia infection affecting pregnancy, antepartum --Tx on 04/29/20  {Blank single:19197::"Term","Preterm"} labor symptoms and general obstetric precautions including but not limited to vaginal bleeding, contractions, leaking of fluid and fetal movement were reviewed in detail with the patient. Please  refer to After Visit Summary for other counseling recommendations.   No follow-ups on file.  Future Appointments  Date Time Provider Department Center  05/25/2020  1:20 PM Hurshel Party, CNM [facility] None  06/11/2020  1:30 PM [facility] NURSE [facility]  06/11/2020  1:45 PM [facility] US5 [facility]    Sharen Counter, CNM

## 2020-06-11 ENCOUNTER — Ambulatory Visit: Payer: Medicaid Other | Attending: Obstetrics

## 2020-06-11 ENCOUNTER — Ambulatory Visit: Payer: Medicaid Other

## 2020-06-15 ENCOUNTER — Ambulatory Visit: Payer: Medicaid Other | Attending: Obstetrics and Gynecology

## 2020-06-15 ENCOUNTER — Ambulatory Visit: Payer: Medicaid Other

## 2020-06-29 ENCOUNTER — Encounter: Payer: Medicaid Other | Admitting: Obstetrics

## 2020-07-10 NOTE — L&D Delivery Note (Signed)
OB/GYN Faculty Practice Delivery Note  Susan Bean is a 25 y.o. G2P1001 s/p vaginal delivery at [redacted]w[redacted]d. She was admitted for spontaneous onset of labor.  ROM: 6h 72m with meconium stained fluid GBS Status: positive; adequate antibiotics prior to delivery Maximum Maternal Temperature: 98.77F  Labor Progress: Pt was found to be in latent labor on admission. She then progressed to complete cervical dilation s/p SROM at 1415 on 10/04/20. She then had an uncomplicated delivery as noted below.  Delivery Date/Time: 10/04/20 at 2111 Delivery: Called to room and patient was complete and pushing. Head delivered ROA. Tight nuchal cord present x1; reduced s/p delivery. Shoulder and body delivered in usual fashion. Infant with spontaneous cry, placed on mother's abdomen, dried and stimulated. Cord clamped x 2 after 1-minute delay, and cut by FOB under my direct supervision. Cord blood drawn. Placenta delivered spontaneously with gentle cord traction. Fundus firm with massage and Pitocin. Labia, perineum, vagina, and cervix were inspected, notable for first degree perineal laceration s/p standard repair with 3-0 vicryl.   Placenta: intact, 3-vessel cord, sent to L&D Complications: none Lacerations: first degree perineal laceration s/p repair EBL: 125 ml Analgesia: epidural  Infant: viable female  APGARs 8 & 9  weight 3430g  Lynnda Shields, MD OB/GYN Fellow, Faculty Practice

## 2020-07-14 ENCOUNTER — Encounter: Payer: Medicaid Other | Admitting: Obstetrics

## 2020-08-02 ENCOUNTER — Inpatient Hospital Stay (HOSPITAL_COMMUNITY)
Admission: AD | Admit: 2020-08-02 | Discharge: 2020-08-02 | Disposition: A | Discharge status: Home / Self Care | Payer: Medicaid Other | Attending: Family Medicine | Admitting: Family Medicine

## 2020-08-02 ENCOUNTER — Other Ambulatory Visit: Payer: Self-pay

## 2020-08-02 ENCOUNTER — Encounter (HOSPITAL_COMMUNITY): Payer: Self-pay | Admitting: Family Medicine

## 2020-08-02 DIAGNOSIS — R109 Unspecified abdominal pain: Secondary | ICD-10-CM | POA: Insufficient documentation

## 2020-08-02 DIAGNOSIS — O26893 Other specified pregnancy related conditions, third trimester: Secondary | ICD-10-CM | POA: Diagnosis not present

## 2020-08-02 DIAGNOSIS — Z79899 Other long term (current) drug therapy: Secondary | ICD-10-CM | POA: Diagnosis not present

## 2020-08-02 DIAGNOSIS — Z3A29 29 weeks gestation of pregnancy: Secondary | ICD-10-CM | POA: Insufficient documentation

## 2020-08-02 LAB — URINALYSIS, ROUTINE W REFLEX MICROSCOPIC
Bilirubin Urine: NEGATIVE
Glucose, UA: NEGATIVE mg/dL
Hgb urine dipstick: NEGATIVE
Ketones, ur: NEGATIVE mg/dL
Nitrite: NEGATIVE
Protein, ur: 30 mg/dL — AB
Specific Gravity, Urine: 1.024 (ref 1.005–1.030)
pH: 6 (ref 5.0–8.0)

## 2020-08-02 LAB — WET PREP, GENITAL
Clue Cells Wet Prep HPF POC: NONE SEEN
Sperm: NONE SEEN
Trich, Wet Prep: NONE SEEN
Yeast Wet Prep HPF POC: NONE SEEN

## 2020-08-02 NOTE — MAU Note (Signed)
Pt reports lower abd cramping x 2 hours, denies bleeding or discharge. Reports decreased fetal movement this am. Denies dysuria.

## 2020-08-02 NOTE — MAU Provider Note (Signed)
History     CSN: [id number]  Arrival date and time: 08/02/20 6226   Event Date/Time   First Provider Initiated Contact with Patient 08/02/20 0901      Chief Complaint  Patient presents with  . Abdominal Pain   HPI This is a 25 yo G2P1001 at 46w5dwho presents with cramping that started at 6am. It continued for a few hours, rated 5/10, so she came here for evaluation. No palliating or provoking factors. Cramping has decreased since arrival. Denies recent sex, vaginal discharge, vaginal bleeding, decreased fetal movement.   OB History    Gravida  2   Para  1   Term  1   Preterm      AB      Living  1     SAB      IAB      Ectopic      Multiple  0   Live Births  1           Past Medical History:  Diagnosis Date  . Medical history non-contributory     Past Surgical History:  Procedure Laterality Date  . NO PAST SURGERIES      Family History  Problem Relation Age of Onset  . Cancer Mother   . Arthritis Mother   . Diabetes Maternal Aunt   . Hypertension Maternal Aunt   . Arthritis Maternal Uncle   . Hypertension Maternal Uncle     Social History   Tobacco Use  . Smoking status: Never Smoker  . Smokeless tobacco: Never Used  Vaping Use  . Vaping Use: Never used  Substance Use Topics  . Alcohol use: Never  . Drug use: Never    Allergies: No Known Allergies  Medications Prior to Admission  Medication Sig Dispense Refill Last Dose  . ferrous sulfate 325 (65 FE) MG tablet Take 1 tablet (325 mg total) by mouth 2 (two) times daily with a meal. 60 tablet 5 Past Month at Unknown time  . prenatal vitamin w/FE, FA (PRENATAL 1 + 1) 27-1 MG TABS tablet Take 1 tablet by mouth daily at 12 noon.   08/02/2020 at Unknown time  . Blood Pressure Monitoring (BLOOD PRESSURE KIT) DEVI 1 kit by Does not apply route once a week. Check Blood Pressure regularly and record readings into the [employer] App.  Large Cuff.  DX O90.0 (Patient not taking: No sig reported)  1 each 0   . cefUROXime (CEFTIN) 500 MG tablet Take 1 tablet (500 mg total) by mouth 2 (two) times daily with a meal. 14 tablet 0   . ibuprofen (ADVIL) 600 MG tablet Take 1 tablet (600 mg total) by mouth every 6 (six) hours. (Patient not taking: No sig reported) 30 tablet 0 More than a month at Unknown time    Review of Systems Physical Exam   Blood pressure 124/72, pulse 89, temperature 98.3 F (36.8 C), temperature source Oral, resp. rate 17, height '5\' 7"'  (1.702 m), weight 116.6 kg, last menstrual period 12/28/2019, SpO2 98 %, unknown if currently breastfeeding.  Physical Exam Vitals and nursing note reviewed. Exam conducted with a chaperone present.  Constitutional:      Appearance: She is well-developed.  Abdominal:     General: Bowel sounds are normal.     Palpations: Abdomen is soft.     Tenderness: There is no abdominal tenderness. There is no guarding or rebound.  Genitourinary:    Vagina: Normal. No signs of injury and foreign body. No  vaginal discharge or erythema.  Skin:    General: Skin is warm and dry.     Capillary Refill: Capillary refill takes less than 2 seconds.  Neurological:     Mental Status: She is alert.    Dilation: Closed Effacement (%): Thick Station: -3 Exam by:: Dr. Nehemiah Settle  Results for orders placed or performed during the hospital encounter of 08/02/20 (from the past 24 hour(s))  Urinalysis, Routine w reflex microscopic Urine, Clean Catch     Status: Abnormal   Collection Time: 08/02/20  8:51 AM  Result Value Ref Range   Color, Urine YELLOW YELLOW   APPearance CLOUDY (A) CLEAR   Specific Gravity, Urine 1.024 1.005 - 1.030   pH 6.0 5.0 - 8.0   Glucose, UA NEGATIVE NEGATIVE mg/dL   Hgb urine dipstick NEGATIVE NEGATIVE   Bilirubin Urine NEGATIVE NEGATIVE   Ketones, ur NEGATIVE NEGATIVE mg/dL   Protein, ur 30 (A) NEGATIVE mg/dL   Nitrite NEGATIVE NEGATIVE   Leukocytes,Ua SMALL (A) NEGATIVE   RBC / HPF 0-5 0 - 5 RBC/hpf   WBC, UA 0-5 0 - 5  WBC/hpf   Bacteria, UA RARE (A) NONE SEEN   Squamous Epithelial / LPF 6-10 0 - 5   Mucus PRESENT   Wet prep, genital     Status: Abnormal   Collection Time: 08/02/20  9:11 AM   Specimen: PATH Cytology Cervicovaginal Ancillary Only; Genital  Result Value Ref Range   Yeast Wet Prep HPF POC NONE SEEN NONE SEEN   Trich, Wet Prep NONE SEEN NONE SEEN   Clue Cells Wet Prep HPF POC NONE SEEN NONE SEEN   WBC, Wet Prep HPF POC MODERATE (A) NONE SEEN   Sperm NONE SEEN      MAU Course  Procedures NST: baseline 130s, moderate variability, 10x10 accelerations, no decels. No contractions seen  MDM Patient not feeling cramping since arrival.   Assessment and Plan  [redacted] weeks gestation of pregnancy  Cramping affecting pregnancy, antepartum  No preterm labor. Patient encouraged to return with any return of symptoms.  Truett Mainland 08/02/2020, 9:11 AM

## 2020-08-03 LAB — GC/CHLAMYDIA PROBE AMP (~~LOC~~) NOT AT ARMC
Chlamydia: NEGATIVE
Comment: NEGATIVE
Comment: NORMAL
Neisseria Gonorrhea: NEGATIVE

## 2020-08-26 ENCOUNTER — Other Ambulatory Visit: Payer: Medicaid Other

## 2020-08-26 ENCOUNTER — Encounter: Payer: Medicaid Other | Admitting: Obstetrics and Gynecology

## 2020-09-02 ENCOUNTER — Encounter: Payer: Medicaid Other | Admitting: Obstetrics and Gynecology

## 2020-09-02 ENCOUNTER — Other Ambulatory Visit: Payer: Medicaid Other

## 2020-09-15 ENCOUNTER — Other Ambulatory Visit (HOSPITAL_COMMUNITY)
Admission: RE | Admit: 2020-09-15 | Discharge: 2020-09-15 | Disposition: A | Discharge status: Home / Self Care | Payer: Medicaid Other | Source: Ambulatory Visit | Attending: Obstetrics and Gynecology | Admitting: Obstetrics and Gynecology

## 2020-09-15 ENCOUNTER — Other Ambulatory Visit: Payer: Self-pay

## 2020-09-15 ENCOUNTER — Encounter: Payer: Self-pay | Admitting: Obstetrics and Gynecology

## 2020-09-15 ENCOUNTER — Ambulatory Visit (INDEPENDENT_AMBULATORY_CARE_PROVIDER_SITE_OTHER): Payer: Medicaid Other | Admitting: Obstetrics and Gynecology

## 2020-09-15 VITALS — BP 122/78 | HR 96 | Wt 267.8 lb

## 2020-09-15 DIAGNOSIS — Z3480 Encounter for supervision of other normal pregnancy, unspecified trimester: Secondary | ICD-10-CM | POA: Insufficient documentation

## 2020-09-15 DIAGNOSIS — A749 Chlamydial infection, unspecified: Secondary | ICD-10-CM

## 2020-09-15 DIAGNOSIS — Z23 Encounter for immunization: Secondary | ICD-10-CM

## 2020-09-15 DIAGNOSIS — Z8759 Personal history of other complications of pregnancy, childbirth and the puerperium: Secondary | ICD-10-CM

## 2020-09-15 DIAGNOSIS — Z3A36 36 weeks gestation of pregnancy: Secondary | ICD-10-CM

## 2020-09-15 DIAGNOSIS — O26899 Other specified pregnancy related conditions, unspecified trimester: Secondary | ICD-10-CM | POA: Diagnosis not present

## 2020-09-15 DIAGNOSIS — O0930 Supervision of pregnancy with insufficient antenatal care, unspecified trimester: Secondary | ICD-10-CM | POA: Insufficient documentation

## 2020-09-15 DIAGNOSIS — O99210 Obesity complicating pregnancy, unspecified trimester: Secondary | ICD-10-CM

## 2020-09-15 DIAGNOSIS — Z6791 Unspecified blood type, Rh negative: Secondary | ICD-10-CM | POA: Diagnosis not present

## 2020-09-15 DIAGNOSIS — O0933 Supervision of pregnancy with insufficient antenatal care, third trimester: Secondary | ICD-10-CM

## 2020-09-15 DIAGNOSIS — O98813 Other maternal infectious and parasitic diseases complicating pregnancy, third trimester: Secondary | ICD-10-CM

## 2020-09-15 MED ORDER — RHO D IMMUNE GLOBULIN 1500 UNIT/2ML IJ SOSY
300.0000 ug | PREFILLED_SYRINGE | Freq: Once | INTRAMUSCULAR | Status: AC
  Administered 2020-09-15: 300 ug via INTRAMUSCULAR

## 2020-09-15 NOTE — Progress Notes (Signed)
Pt is in the office for ROB, has not been seen since NOB visit on 04-27-20 due to transportation issues. Pt reports fetal movement and irregular contractions.

## 2020-09-15 NOTE — Progress Notes (Signed)
PRENATAL VISIT NOTE  Subjective:  Susan Bean is a 25 y.o. G2P1001 at [redacted]w[redacted]d being seen today for ongoing prenatal care.  She is currently monitored for the following issues for this high-risk pregnancy and has Normal labor; SVD (spontaneous vaginal delivery); Shoulder dystocia during labor and delivery, delivered; Chlamydia infection affecting pregnancy in third trimester; Supervision of other normal pregnancy, antepartum; [redacted] weeks gestation of pregnancy; Limited prenatal care; and Obesity in pregnancy on their problem list.  Patient reports transportation issues as a barrier to prenatal care.  Contractions: Irregular. Vag. Bleeding: None.  Movement: Present. Denies leaking of fluid.   The following portions of the patient's history were reviewed and updated as appropriate: allergies, current medications, past family history, past medical history, past social history, past surgical history and problem list.   Objective:   Vitals:   09/15/20 1555  BP: 122/78  Pulse: 96  Weight: 267 lb 12.8 oz (121.5 kg)    Fetal Status: Fetal Heart Rate (bpm): 150   Movement: Present     General:  Alert, oriented and cooperative. Patient is in no acute distress.  Skin: Skin is warm and dry. No rash noted.   Cardiovascular: Normal heart rate noted  Respiratory: Normal respiratory effort, no problems with respiration noted  Abdomen: Soft, gravid, appropriate for gestational age.  Pain/Pressure: Present     Pelvic: Cervical exam performed in the presence of a chaperone        Extremities: Normal range of motion.  Edema: None  Mental Status: Normal mood and affect. Normal behavior. Normal judgment and thought content.   Assessment and Plan:  Pregnancy: G2P1001 at [redacted]w[redacted]d 1. Supervision of other normal pregnancy, antepartum: Pt presents for prenatal appt. Of note, last prenatal appointment in 04/2020. +FHTs and +FM. Normal blood pressure. Fundal height greater than dates as noted.  - continue prenatal  vitamin and ASA 81mg  daily - Tdap & rhogam administered today - Strep Gp B NAA - Cervicovaginal ancillary only( [place]) - MFM OB FOLLOW UP; Future - HgB A1c - CBC - HIV antibody (with reflex) - RPR - rtc 1 week or sooner for strict return precautions as noted  2. Rh negative state in antepartum period: Rhogam administered s/p counseling. Pt unsure if she received rhogam in prior pregnancy. - rho (d) immune globulin (RHIG/RHOPHYLAC) injection 300 mcg - [place] MFM OB FOLLOW UP; Future  3. History of shoulder dystocia in prior pregnancy  Size greater than dates: EFW 56% at [redacted]w[redacted]d. Pt did not present for 2hr gtt. Prior BW 3864g in s/o shoulder dystocia. Fundal height today measuring greater than dates. - emphasized importance of f/u ultrasound - [redacted]w[redacted]d MFM OB FOLLOW UP; Future - f/u A1c given unable to perform gtt today  4. [redacted] weeks gestation of pregnancy - as noted above  5. Chlamydia infection affecting pregnancy in third trimester: s/p negative TOC 08/02/20. No GU symptoms today.  6. Limited prenatal care: No reported safety concerns today. Pt reports no prenatal care with first pregnancy. Pt today reports transportation issues as barrier to current prenatal care. - plan for SW consult s/p delivery  Preterm labor symptoms and general obstetric precautions including but not limited to vaginal bleeding, contractions, leaking of fluid and fetal movement were reviewed in detail with the patient. Please refer to After Visit Summary for other counseling recommendations.   Return in about 1 week (around 09/22/2020) for f/u prenatal any provider, in person.  Future Appointments  Date Time Provider Department Center  09/22/2020 11:15 AM Nugent, Odie Sera, NP [facility] None  09/22/2020  2:15 PM [facility] NURSE [facility]  09/22/2020  2:30 PM [facility] US3 [facility]    Keante Urizar, Skipper Cliche, MD OB Fellow, Faculty Practice

## 2020-09-15 NOTE — Patient Instructions (Signed)

## 2020-09-16 LAB — CBC
Hematocrit: 26.8 % — ABNORMAL LOW (ref 34.0–46.6)
Hemoglobin: 7.4 g/dL — ABNORMAL LOW (ref 11.1–15.9)
MCH: 16.3 pg — ABNORMAL LOW (ref 26.6–33.0)
MCHC: 27.6 g/dL — ABNORMAL LOW (ref 31.5–35.7)
MCV: 59 fL — ABNORMAL LOW (ref 79–97)
NRBC: 1 % — ABNORMAL HIGH (ref 0–0)
Platelets: 448 10*3/uL (ref 150–450)
RBC: 4.54 x10E6/uL (ref 3.77–5.28)
RDW: 23.4 % — ABNORMAL HIGH (ref 11.7–15.4)
WBC: 13.8 10*3/uL — ABNORMAL HIGH (ref 3.4–10.8)

## 2020-09-16 LAB — RPR: RPR Ser Ql: NONREACTIVE

## 2020-09-16 LAB — HEMOGLOBIN A1C
Est. average glucose Bld gHb Est-mCnc: 120 mg/dL
Hgb A1c MFr Bld: 5.8 % — ABNORMAL HIGH (ref 4.8–5.6)

## 2020-09-16 LAB — HIV ANTIBODY (ROUTINE TESTING W REFLEX): HIV Screen 4th Generation wRfx: NONREACTIVE

## 2020-09-17 LAB — CERVICOVAGINAL ANCILLARY ONLY
Chlamydia: NEGATIVE
Comment: NEGATIVE
Comment: NORMAL
Neisseria Gonorrhea: NEGATIVE

## 2020-09-17 LAB — STREP GP B NAA: Strep Gp B NAA: POSITIVE — AB

## 2020-09-18 ENCOUNTER — Other Ambulatory Visit: Payer: Self-pay | Admitting: Obstetrics and Gynecology

## 2020-09-18 NOTE — Progress Notes (Signed)
Hgb 7.4, MCV 59 in third trimester of pregnancy. Order placed for IV iron at outpatient infusion center. Message sent to [facility] clinic admin pool to schedule.  Sheila Oats, MD OB Fellow, Faculty Practice 09/18/2020 10:56 PM

## 2020-09-20 ENCOUNTER — Inpatient Hospital Stay (HOSPITAL_COMMUNITY)
Admission: AD | Admit: 2020-09-20 | Discharge: 2020-09-20 | Disposition: A | Discharge status: Home / Self Care | Payer: Medicaid Other | Attending: Obstetrics & Gynecology | Admitting: Obstetrics & Gynecology

## 2020-09-20 ENCOUNTER — Other Ambulatory Visit: Payer: Self-pay

## 2020-09-20 ENCOUNTER — Encounter (HOSPITAL_COMMUNITY): Payer: Self-pay | Admitting: Obstetrics & Gynecology

## 2020-09-20 DIAGNOSIS — O479 False labor, unspecified: Secondary | ICD-10-CM

## 2020-09-20 DIAGNOSIS — O4703 False labor before 37 completed weeks of gestation, third trimester: Secondary | ICD-10-CM | POA: Diagnosis not present

## 2020-09-20 DIAGNOSIS — O471 False labor at or after 37 completed weeks of gestation: Secondary | ICD-10-CM | POA: Diagnosis not present

## 2020-09-20 DIAGNOSIS — Z3A36 36 weeks gestation of pregnancy: Secondary | ICD-10-CM | POA: Diagnosis not present

## 2020-09-20 NOTE — MAU Provider Note (Signed)
Event Date/Time   First Provider Initiated Contact with Patient 09/20/20 2017     S: Ms. Susan Bean is a 25 y.o. G2P1001 at [redacted]w[redacted]d  who presents to MAU today complaining contractions q 1-2 hours since 1600. She denies vaginal bleeding. She denies LOF. She reports normal fetal movement.    O: BP 135/72   Pulse 91   Temp 98 F (36.7 C)   Resp 18   Ht 5\' 7"  (1.702 m)   Wt 118.4 kg   LMP 12/28/2019 (Exact Date)   BMI 40.88 kg/m  GENERAL: Well-developed, well-nourished female in no acute distress.  HEAD: Normocephalic, atraumatic.  CHEST: Normal effort of breathing, regular heart rate ABDOMEN: Soft, nontender, gravid  Cervical exam:  Dilation: Closed Effacement (%): Thick Cervical Position: Posterior Station: Ballotable Presentation: Undeterminable Exam by:: Holly Flippin RN   Fetal Monitoring: Baseline: 140 Variability: moderate Accelerations: 15x15 Decelerations: none Contractions: none   A: SIUP at [redacted]w[redacted]d  False labor  P: -Discharge home in stable condition -Labor precautions discussed -Patient advised to follow-up with OB as scheduled for prenatal care -Patient may return to MAU as needed or if her condition were to change or worsen   [redacted]w[redacted]d, Rolm Bookbinder 09/20/2020 8:17 PM

## 2020-09-20 NOTE — MAU Note (Signed)
Pt reports she started feeling ct around 4 pm about 1 every 1-2 hrs. Denies any vag bleeding or leaking. Good fetal movement felt.

## 2020-09-20 NOTE — Discharge Instructions (Signed)
Safe Medications in Pregnancy   Acne: Benzoyl Peroxide Salicylic Acid  Backache/Headache: Tylenol: 2 regular strength every 4 hours OR              2 Extra strength every 6 hours  Colds/Coughs/Allergies: Benadryl (alcohol free) 25 mg every 6 hours as needed Breath right strips Claritin Cepacol throat lozenges Chloraseptic throat spray Cold-Eeze- up to three times per day Cough drops, alcohol free Flonase (by prescription only) Guaifenesin Mucinex Robitussin DM (plain only, alcohol free) Saline nasal spray/drops Sudafed (pseudoephedrine) & Actifed ** use only after [redacted] weeks gestation and if you do not have high blood pressure Tylenol Vicks Vaporub Zinc lozenges Zyrtec   Constipation: Colace Ducolax suppositories Fleet enema Glycerin suppositories Metamucil Milk of magnesia Miralax Senokot Smooth move tea  Diarrhea: Kaopectate Imodium A-D  *NO pepto Bismol  Hemorrhoids: Anusol Anusol HC Preparation H Tucks  Indigestion: Tums Maalox Mylanta Zantac  Pepcid  Insomnia: Benadryl (alcohol free) 25mg every 6 hours as needed Tylenol PM Unisom, no Gelcaps  Leg Cramps: Tums MagGel  Nausea/Vomiting:  Bonine Dramamine Emetrol Ginger extract Sea bands Meclizine  Nausea medication to take during pregnancy:  Unisom (doxylamine succinate 25 mg tablets) Take one tablet daily at bedtime. If symptoms are not adequately controlled, the dose can be increased to a maximum recommended dose of two tablets daily (1/2 tablet in the morning, 1/2 tablet mid-afternoon and one at bedtime). Vitamin B6 100mg tablets. Take one tablet twice a day (up to 200 mg per day).  Skin Rashes: Aveeno products Benadryl cream or 25mg every 6 hours as needed Calamine Lotion 1% cortisone cream  Yeast infection: Gyne-lotrimin 7 Monistat 7   **If taking multiple medications, please check labels to avoid duplicating the same active ingredients **take medication as directed on  the label ** Do not exceed 4000 mg of tylenol in 24 hours **Do not take medications that contain aspirin or ibuprofen     Rosen's Emergency Medicine: Concepts and Clinical Practice (9th ed., pp. 2296- 2312). Elsevier.">  Braxton Hicks Contractions Contractions of the uterus can occur throughout pregnancy, but they are not always a sign that you are in labor. You may have practice contractions called Braxton Hicks contractions. These false labor contractions are sometimes confused with true labor. What are Braxton Hicks contractions? Braxton Hicks contractions are tightening movements that occur in the muscles of the uterus before labor. Unlike true labor contractions, these contractions do not result in opening (dilation) and thinning of the cervix. Toward the end of pregnancy (32-34 weeks), Braxton Hicks contractions can happen more often and may become stronger. These contractions are sometimes difficult to tell apart from true labor because they can be very uncomfortable. You should not feel embarrassed if you go to the hospital with false labor. Sometimes, the only way to tell if you are in true labor is for your health care provider to look for changes in the cervix. The health care provider will do a physical exam and may monitor your contractions. If you are not in true labor, the exam should show that your cervix is not dilating and your water has not broken. If there are no other health problems associated with your pregnancy, it is completely safe for you to be sent home with false labor. You may continue to have Braxton Hicks contractions until you go into true labor. How to tell the difference between true labor and false labor True labor  Contractions last 30-70 seconds.  Contractions become very regular.  Discomfort   is usually felt in the top of the uterus, and it spreads to the lower abdomen and low back.  Contractions do not go away with walking.  Contractions usually become  more intense and increase in frequency.  The cervix dilates and gets thinner. False labor  Contractions are usually shorter and not as strong as true labor contractions.  Contractions are usually irregular.  Contractions are often felt in the front of the lower abdomen and in the groin.  Contractions may go away when you walk around or change positions while lying down.  Contractions get weaker and are shorter-lasting as time goes on.  The cervix usually does not dilate or become thin. Follow these instructions at home:  Take over-the-counter and prescription medicines only as told by your health care provider.  Keep up with your usual exercises and follow other instructions from your health care provider.  Eat and drink lightly if you think you are going into labor.  If Braxton Hicks contractions are making you uncomfortable: ? Change your position from lying down or resting to walking, or change from walking to resting. ? Sit and rest in a tub of warm water. ? Drink enough fluid to keep your urine pale yellow. Dehydration may cause these contractions. ? Do slow and deep breathing several times an hour.  Keep all follow-up prenatal visits as told by your health care provider. This is important.   Contact a health care provider if:  You have a fever.  You have continuous pain in your abdomen. Get help right away if:  Your contractions become stronger, more regular, and closer together.  You have fluid leaking or gushing from your vagina.  You pass blood-tinged mucus (bloody show).  You have bleeding from your vagina.  You have low back pain that you never had before.  You feel your baby's head pushing down and causing pelvic pressure.  Your baby is not moving inside you as much as it used to. Summary  Contractions that occur before labor are called Braxton Hicks contractions, false labor, or practice contractions.  Braxton Hicks contractions are usually shorter,  weaker, farther apart, and less regular than true labor contractions. True labor contractions usually become progressively stronger and regular, and they become more frequent.  Manage discomfort from [facility] contractions by changing position, resting in a warm bath, drinking plenty of water, or practicing deep breathing. This information is not intended to replace advice given to you by your health care provider. Make sure you discuss any questions you have with your health care provider. Document Revised: 06/08/2017 Document Reviewed: 11/09/2016 Elsevier Patient Education  [place].

## 2020-09-22 ENCOUNTER — Ambulatory Visit: Payer: Medicaid Other

## 2020-09-22 ENCOUNTER — Encounter: Payer: Medicaid Other | Admitting: Women's Health

## 2020-09-23 ENCOUNTER — Ambulatory Visit: Payer: Medicaid Other | Attending: Obstetrics and Gynecology

## 2020-09-23 ENCOUNTER — Encounter: Payer: Self-pay | Admitting: *Deleted

## 2020-09-23 ENCOUNTER — Ambulatory Visit: Payer: Medicaid Other | Admitting: *Deleted

## 2020-09-23 ENCOUNTER — Other Ambulatory Visit: Payer: Self-pay

## 2020-09-23 VITALS — BP 114/68 | HR 89

## 2020-09-23 DIAGNOSIS — Z3480 Encounter for supervision of other normal pregnancy, unspecified trimester: Secondary | ICD-10-CM | POA: Insufficient documentation

## 2020-09-23 DIAGNOSIS — D563 Thalassemia minor: Secondary | ICD-10-CM | POA: Diagnosis present

## 2020-09-23 DIAGNOSIS — Z8759 Personal history of other complications of pregnancy, childbirth and the puerperium: Secondary | ICD-10-CM | POA: Insufficient documentation

## 2020-09-23 DIAGNOSIS — O26899 Other specified pregnancy related conditions, unspecified trimester: Secondary | ICD-10-CM | POA: Insufficient documentation

## 2020-09-23 DIAGNOSIS — Z6791 Unspecified blood type, Rh negative: Secondary | ICD-10-CM | POA: Diagnosis not present

## 2020-09-26 ENCOUNTER — Inpatient Hospital Stay (HOSPITAL_COMMUNITY)
Admission: AD | Admit: 2020-09-26 | Discharge: 2020-09-26 | Disposition: A | Discharge status: Home / Self Care | Payer: Medicaid Other | Attending: Obstetrics & Gynecology | Admitting: Obstetrics & Gynecology

## 2020-09-26 ENCOUNTER — Other Ambulatory Visit: Payer: Self-pay | Admitting: Obstetrics and Gynecology

## 2020-09-26 ENCOUNTER — Encounter (HOSPITAL_COMMUNITY): Payer: Self-pay | Admitting: Obstetrics & Gynecology

## 2020-09-26 ENCOUNTER — Other Ambulatory Visit: Payer: Self-pay

## 2020-09-26 DIAGNOSIS — D649 Anemia, unspecified: Secondary | ICD-10-CM | POA: Diagnosis not present

## 2020-09-26 DIAGNOSIS — O99013 Anemia complicating pregnancy, third trimester: Secondary | ICD-10-CM | POA: Insufficient documentation

## 2020-09-26 DIAGNOSIS — D509 Iron deficiency anemia, unspecified: Secondary | ICD-10-CM

## 2020-09-26 DIAGNOSIS — O479 False labor, unspecified: Secondary | ICD-10-CM

## 2020-09-26 DIAGNOSIS — O0993 Supervision of high risk pregnancy, unspecified, third trimester: Secondary | ICD-10-CM

## 2020-09-26 DIAGNOSIS — Z3A37 37 weeks gestation of pregnancy: Secondary | ICD-10-CM | POA: Diagnosis not present

## 2020-09-26 DIAGNOSIS — O471 False labor at or after 37 completed weeks of gestation: Secondary | ICD-10-CM | POA: Insufficient documentation

## 2020-09-26 MED ORDER — FERROUS SULFATE 325 (65 FE) MG PO TABS: 325.0000 mg | ORAL_TABLET | ORAL | 2 refills | Status: DC

## 2020-09-26 MED ORDER — SODIUM CHLORIDE 0.9 % IV SOLN
510.0000 mg | Freq: Once | INTRAVENOUS | Status: DC
  Filled 2020-09-26: qty 17

## 2020-09-26 NOTE — MAU Note (Signed)
Having some ctxs tonight and wanted to be sure I am not dilating. Denies VB or LOF. Good FM. Last sve was closed

## 2020-09-26 NOTE — Discharge Instructions (Signed)
You are scheduled for an induction of labor at 39 weeks given your history of shoulder dystocia. You need to arrive at the hospital on Wednesday, March 30th at 11:55PM.  It is very important that you take your iron supplement every other day given your low hemoglobin level.  Rosen's Emergency Medicine: Concepts and Clinical Practice (9th ed., pp. 2296- 2312). Elsevier.">  Susan Bean Bean of the uterus can occur throughout pregnancy, but they are not always a sign that you are in labor. You may have practice Bean called Susan Bean. These false labor Bean are sometimes confused with true labor. What are Susan Bean Bean? Susan Bean are tightening movements that occur in the muscles of the uterus before labor. Unlike true labor Bean, these contractions do not result in opening (dilation) and thinning of the cervix. Toward the end of pregnancy (32-34 weeks), Susan Bean can happen more often and may become stronger. These Bean are sometimes difficult to tell apart from true labor because they can be very uncomfortable. You should not feel embarrassed if you go to the hospital with false labor. Sometimes, the only way to tell if you are in true labor is for your health care provider to look for changes in the cervix. The health care provider will do a physical exam and may monitor your Bean. If you are not in true labor, the exam should show that your cervix is not dilating and your water has not broken. If there are no other health problems associated with your pregnancy, it is completely safe for you to be sent home with false labor. You may continue to have Susan Bean until you go into true labor. How to tell the difference between true labor and false labor True labor  Bean last 30-70 seconds.  Bean become very regular.  Discomfort is usually felt  in the top of the uterus, and it spreads to the lower abdomen and low back.  Contractions do not go away with walking.  Bean usually become more intense and increase in frequency.  The cervix dilates and gets thinner. False labor  Bean are usually shorter and not as strong as true labor Bean.  Bean are usually irregular.  Bean are often felt in the front of the lower abdomen and in the groin.  Bean may go away when you walk around or change positions while lying down.  Bean get weaker and are shorter-lasting as time goes on.  The cervix usually does not dilate or become thin. Follow these instructions at home:  Take over-the-counter and prescription medicines only as told by your health care provider.  Keep up with your usual exercises and follow other instructions from your health care provider.  Eat and drink lightly if you think you are going into labor.  If Susan Bean are making you uncomfortable: ? Change your position from lying down or resting to walking, or change from walking to resting. ? Sit and rest in a tub of warm water. ? Drink enough fluid to keep your urine pale yellow. Dehydration may cause these Bean. ? Do slow and deep breathing several times an hour.  Keep all follow-up prenatal visits as told by your health care provider. This is important.   Contact a health care provider if:  You have a fever.  You have continuous pain in your abdomen. Get help right away if:  Your Bean become stronger, more regular, and closer together.  You have fluid  leaking or gushing from your vagina.  You pass blood-tinged mucus (bloody show).  You have bleeding from your vagina.  You have low back pain that you never had before.  You feel your baby's head pushing down and causing pelvic pressure.  Your baby is not moving inside you as much as it used to. Summary  Bean  that occur before labor are called Susan Bean, false labor, or practice Bean.  Susan Bean are usually shorter, weaker, farther apart, and less regular than true labor Bean. True labor Bean usually become progressively stronger and regular, and they become more frequent.  Manage discomfort from [facility] Bean by changing position, resting in a warm bath, drinking plenty of water, or practicing deep breathing. This information is not intended to replace advice given to you by your health care provider. Make sure you discuss any questions you have with your health care provider. Document Revised: 06/08/2017 Document Reviewed: 11/09/2016 Elsevier Patient Education  2021 Elsevier Inc.   Fetal Movement Counts Patient Name: ________________________________________________ Patient Due Date: ____________________  What is a fetal movement count? A fetal movement count is the number of times that you feel your baby move during a certain amount of time. This may also be called a fetal kick count. A fetal movement count is recommended for every pregnant woman. You may be asked to start counting fetal movements as early as week 28 of your pregnancy. Pay attention to when your baby is most active. You may notice your baby's sleep and wake cycles. You may also notice things that make your baby move more. You should do a fetal movement count:  When your baby is normally most active.  At the same time each day. A good time to count movements is while you are resting, after having something to eat and drink. How do I count fetal movements? 1. Find a quiet, comfortable area. Sit, or lie down on your side. 2. Write down the date, the start time and stop time, and the number of movements that you felt between those two times. Take this information with you to your health care visits. 3. Write down your start time when you feel the first  movement. 4. Count kicks, flutters, swishes, rolls, and jabs. You should feel at least 10 movements. 5. You may stop counting after you have felt 10 movements, or if you have been counting for 2 hours. Write down the stop time. 6. If you do not feel 10 movements in 2 hours, contact your health care provider for further instructions. Your health care provider may want to do additional tests to assess your baby's well-being. Contact a health care provider if:  You feel fewer than 10 movements in 2 hours.  Your baby is not moving like he or she usually does. Date: ____________ Start time: ____________ Stop time: ____________ Movements: ____________ Date: ____________ Start time: ____________ Stop time: ____________ Movements: ____________ Date: ____________ Start time: ____________ Stop time: ____________ Movements: ____________ Date: ____________ Start time: ____________ Stop time: ____________ Movements: ____________ Date: ____________ Start time: ____________ Stop time: ____________ Movements: ____________ Date: ____________ Start time: ____________ Stop time: ____________ Movements: ____________ Date: ____________ Start time: ____________ Stop time: ____________ Movements: ____________ Date: ____________ Start time: ____________ Stop time: ____________ Movements: ____________ Date: ____________ Start time: ____________ Stop time: ____________ Movements: ____________ This information is not intended to replace advice given to you by your health care provider. Make sure you discuss any questions you have with your health  care provider. Document Revised: 02/13/2019 Document Reviewed: 02/13/2019 Elsevier Patient Education  2021 [employer].

## 2020-09-26 NOTE — Progress Notes (Signed)
Orders placed for IOL secondary to h/o shoulder dystocia. Limited prenatal care (pt missed gtt).

## 2020-09-26 NOTE — MAU Provider Note (Signed)
S: Ms. Susan Bean is a 25 y.o. G2P1001 at [redacted]w[redacted]d  who presents to MAU today for labor evaluation.    Cervical exam by RN:  Dilation: Closed Effacement (%): Thick Cervical Position: Posterior Station: Ballotable Exam by:: Ralene Bathe RN  Fetal Monitoring: Baseline: 150 Variability: moderate Accelerations: multiple, reactive strip Decelerations: none Contractions: irregular  MDM Discussed patient with RN. NST reviewed.  Counseled pt extensively on recommendation to receive IV iron prior to discharge from MAU given Hgb 7.4 in clinic on 09/15/20 and multiple missed prenatal appts. However, pt strongly declines given "she is unsure how the infusion will make her feel". Pt agrees to pick up her po iron supplement this evening. She states she had still not received a call to schedule her IV iron treatment at the infusion center. She has been scheduled for an IOL at 39w GA given h/o shoulder dystocia per MFM.  A: SIUP at [redacted]w[redacted]d  False labor Anemia in pregnancy  P: Discharge home Labor precautions and kick counts included in AVS Patient to follow-up with IOL on 3/31 as scheduled (pt aware of need to present at 11:55PM on 10/06/20). Sent message to [facility] to try to schedule outpatient IV iron infusion prior to induction of labor. Patient may return to MAU as needed or when in labor   Sheila Oats, MD 09/26/2020 8:43 PM

## 2020-09-28 ENCOUNTER — Encounter (HOSPITAL_COMMUNITY): Payer: Self-pay | Admitting: *Deleted

## 2020-09-28 ENCOUNTER — Telehealth (HOSPITAL_COMMUNITY): Payer: Self-pay | Admitting: *Deleted

## 2020-09-28 NOTE — Telephone Encounter (Signed)
Preadmission screen  

## 2020-09-29 ENCOUNTER — Other Ambulatory Visit: Payer: Self-pay | Admitting: Advanced Practice Midwife

## 2020-10-04 ENCOUNTER — Encounter (HOSPITAL_COMMUNITY): Payer: Self-pay | Admitting: Family Medicine

## 2020-10-04 ENCOUNTER — Other Ambulatory Visit: Payer: Self-pay

## 2020-10-04 ENCOUNTER — Inpatient Hospital Stay (HOSPITAL_COMMUNITY): Payer: Medicaid Other | Admitting: Anesthesiology

## 2020-10-04 ENCOUNTER — Inpatient Hospital Stay (HOSPITAL_COMMUNITY)
Admission: AD | Admit: 2020-10-04 | Discharge: 2020-10-06 | DRG: 807 | Disposition: A | Discharge status: Home / Self Care | Payer: Medicaid Other | Attending: Obstetrics and Gynecology | Admitting: Obstetrics and Gynecology

## 2020-10-04 DIAGNOSIS — O99214 Obesity complicating childbirth: Secondary | ICD-10-CM | POA: Diagnosis present

## 2020-10-04 DIAGNOSIS — Z6791 Unspecified blood type, Rh negative: Secondary | ICD-10-CM | POA: Diagnosis not present

## 2020-10-04 DIAGNOSIS — Z8759 Personal history of other complications of pregnancy, childbirth and the puerperium: Secondary | ICD-10-CM

## 2020-10-04 DIAGNOSIS — Z20822 Contact with and (suspected) exposure to covid-19: Secondary | ICD-10-CM | POA: Diagnosis present

## 2020-10-04 DIAGNOSIS — O9902 Anemia complicating childbirth: Secondary | ICD-10-CM | POA: Diagnosis present

## 2020-10-04 DIAGNOSIS — Z3A38 38 weeks gestation of pregnancy: Secondary | ICD-10-CM | POA: Diagnosis not present

## 2020-10-04 DIAGNOSIS — D563 Thalassemia minor: Secondary | ICD-10-CM | POA: Diagnosis present

## 2020-10-04 DIAGNOSIS — O99824 Streptococcus B carrier state complicating childbirth: Secondary | ICD-10-CM | POA: Diagnosis present

## 2020-10-04 DIAGNOSIS — O0930 Supervision of pregnancy with insufficient antenatal care, unspecified trimester: Secondary | ICD-10-CM

## 2020-10-04 DIAGNOSIS — D649 Anemia, unspecified: Secondary | ICD-10-CM | POA: Diagnosis not present

## 2020-10-04 DIAGNOSIS — O26893 Other specified pregnancy related conditions, third trimester: Secondary | ICD-10-CM | POA: Diagnosis present

## 2020-10-04 DIAGNOSIS — O9903 Anemia complicating the puerperium: Secondary | ICD-10-CM | POA: Diagnosis not present

## 2020-10-04 LAB — CBC
HCT: 27.8 % — ABNORMAL LOW (ref 36.0–46.0)
Hemoglobin: 7.4 g/dL — ABNORMAL LOW (ref 12.0–15.0)
MCH: 16.8 pg — ABNORMAL LOW (ref 26.0–34.0)
MCHC: 26.6 g/dL — ABNORMAL LOW (ref 30.0–36.0)
MCV: 63.2 fL — ABNORMAL LOW (ref 80.0–100.0)
Platelets: 381 10*3/uL (ref 150–400)
RBC: 4.4 MIL/uL (ref 3.87–5.11)
RDW: 24.3 % — ABNORMAL HIGH (ref 11.5–15.5)
WBC: 13.6 10*3/uL — ABNORMAL HIGH (ref 4.0–10.5)
nRBC: 0.4 % — ABNORMAL HIGH (ref 0.0–0.2)

## 2020-10-04 LAB — RESP PANEL BY RT-PCR (FLU A&B, COVID) ARPGX2
Influenza A by PCR: NEGATIVE
Influenza B by PCR: NEGATIVE
SARS Coronavirus 2 by RT PCR: NEGATIVE

## 2020-10-04 MED ORDER — OXYTOCIN BOLUS FROM INFUSION
333.0000 mL | Freq: Once | INTRAVENOUS | Status: AC
  Administered 2020-10-04: 333 mL via INTRAVENOUS

## 2020-10-04 MED ORDER — EPHEDRINE 5 MG/ML INJ: 10.0000 mg | INTRAVENOUS | Status: DC | PRN

## 2020-10-04 MED ORDER — PENICILLIN G POT IN DEXTROSE 60000 UNIT/ML IV SOLN: 3.0000 10*6.[IU] | INTRAVENOUS | Status: DC

## 2020-10-04 MED ORDER — LACTATED RINGERS IV SOLN: 500.0000 mL | Freq: Once | INTRAVENOUS | Status: DC

## 2020-10-04 MED ORDER — LIDOCAINE HCL (PF) 1 % IJ SOLN
INTRAMUSCULAR | Status: DC | PRN
  Administered 2020-10-04: 2 mL via EPIDURAL
  Administered 2020-10-04: 5 mL via EPIDURAL
  Administered 2020-10-04: 3 mL via EPIDURAL

## 2020-10-04 MED ORDER — ACETAMINOPHEN 325 MG PO TABS: 650.0000 mg | ORAL_TABLET | ORAL | Status: DC | PRN

## 2020-10-04 MED ORDER — FENTANYL-BUPIVACAINE-NACL 0.5-0.125-0.9 MG/250ML-% EP SOLN
12.0000 mL/h | EPIDURAL | Status: DC | PRN
Start: 2020-10-04 — End: 2020-10-04
  Administered 2020-10-04: 12 mL/h via EPIDURAL
  Filled 2020-10-04: qty 250

## 2020-10-04 MED ORDER — SODIUM CHLORIDE 0.9 % IV SOLN
1.0000 g | INTRAVENOUS | Status: DC
  Administered 2020-10-04: 1 g via INTRAVENOUS
  Filled 2020-10-04 (×2): qty 1000

## 2020-10-04 MED ORDER — PHENYLEPHRINE 40 MCG/ML (10ML) SYRINGE FOR IV PUSH (FOR BLOOD PRESSURE SUPPORT)
80.0000 ug | PREFILLED_SYRINGE | INTRAVENOUS | Status: DC | PRN
  Filled 2020-10-04: qty 10

## 2020-10-04 MED ORDER — FENTANYL CITRATE (PF) 100 MCG/2ML IJ SOLN: 100.0000 ug | INTRAMUSCULAR | Status: DC | PRN

## 2020-10-04 MED ORDER — SODIUM CHLORIDE 0.9 % IV SOLN
2.0000 g | Freq: Once | INTRAVENOUS | Status: AC
  Administered 2020-10-04: 2 g via INTRAVENOUS
  Filled 2020-10-04: qty 2000

## 2020-10-04 MED ORDER — LIDOCAINE HCL (PF) 1 % IJ SOLN: 30.0000 mL | INTRAMUSCULAR | Status: DC | PRN

## 2020-10-04 MED ORDER — TRANEXAMIC ACID-NACL 1000-0.7 MG/100ML-% IV SOLN
1000.0000 mg | Freq: Once | INTRAVENOUS | Status: DC
  Filled 2020-10-04: qty 100

## 2020-10-04 MED ORDER — LACTATED RINGERS IV SOLN: INTRAVENOUS | Status: DC

## 2020-10-04 MED ORDER — OXYCODONE-ACETAMINOPHEN 5-325 MG PO TABS
2.0000 | ORAL_TABLET | ORAL | Status: DC | PRN
Start: 2020-10-04 — End: 2020-10-04

## 2020-10-04 MED ORDER — DIPHENHYDRAMINE HCL 50 MG/ML IJ SOLN: 12.5000 mg | INTRAMUSCULAR | Status: DC | PRN

## 2020-10-04 MED ORDER — SOD CITRATE-CITRIC ACID 500-334 MG/5ML PO SOLN: 30.0000 mL | ORAL | Status: DC | PRN

## 2020-10-04 MED ORDER — LACTATED RINGERS IV SOLN: 500.0000 mL | INTRAVENOUS | Status: DC | PRN

## 2020-10-04 MED ORDER — PHENYLEPHRINE 40 MCG/ML (10ML) SYRINGE FOR IV PUSH (FOR BLOOD PRESSURE SUPPORT): 80.0000 ug | PREFILLED_SYRINGE | INTRAVENOUS | Status: DC | PRN

## 2020-10-04 MED ORDER — SODIUM CHLORIDE 0.9 % IV SOLN: 5.0000 10*6.[IU] | Freq: Once | INTRAVENOUS | Status: DC

## 2020-10-04 MED ORDER — OXYCODONE-ACETAMINOPHEN 5-325 MG PO TABS: 1.0000 | ORAL_TABLET | ORAL | Status: DC | PRN

## 2020-10-04 MED ORDER — OXYTOCIN-SODIUM CHLORIDE 30-0.9 UT/500ML-% IV SOLN
2.5000 [IU]/h | INTRAVENOUS | Status: DC
  Filled 2020-10-04: qty 500

## 2020-10-04 MED ORDER — ONDANSETRON HCL 4 MG/2ML IJ SOLN: 4.0000 mg | Freq: Four times a day (QID) | INTRAMUSCULAR | Status: DC | PRN

## 2020-10-04 NOTE — Anesthesia Procedure Notes (Signed)
Epidural Patient location during procedure: OB Start time: 10/04/2020 1:43 PM End time: 10/04/2020 1:51 PM  Staffing Anesthesiologist: Cecile Hearing, MD Performed: anesthesiologist   Preanesthetic Checklist Completed: patient identified, IV checked, risks and benefits discussed, monitors and equipment checked, pre-op evaluation and timeout performed  Epidural Patient position: sitting Prep: DuraPrep Patient monitoring: blood pressure and continuous pulse ox Approach: midline Location: L3-L4 Injection technique: LOR air  Needle:  Needle type: Tuohy  Needle gauge: 17 G Needle length: 9 cm Needle insertion depth: 9 cm Catheter size: 19 Gauge Catheter at skin depth: 14 cm Test dose: negative and Other (1% Lidocaine)  Additional Notes Patient identified.  Risk benefits discussed including failed block, incomplete pain control, headache, nerve damage, paralysis, blood pressure changes, nausea, vomiting, reactions to medication both toxic or allergic, and postpartum back pain.  Patient expressed understanding and wished to proceed.  All questions were answered.  Sterile technique used throughout procedure and epidural site dressed with sterile barrier dressing. No paresthesia or other complications noted. The patient did not experience any signs of intravascular injection such as tinnitus or metallic taste in mouth nor signs of intrathecal spread such as rapid motor block. Please see nursing notes for vital signs. Reason for block:procedure for pain

## 2020-10-04 NOTE — Lactation Note (Signed)
Lactation Consultation Note  Patient Name: Susan Bean Date: 10/04/2020 Reason for consult: L&D Initial assessment;1st time breastfeeding Age:25 y.o.  LC Student entered the room; support person was on the couch, mom was reclined with baby STS on her chest. LC Student asked how delivery went and how mom was feeding. Mom shared it went well and seemed in high spirits.  LC Student noted that baby was rooting and cueing, crying on occasion. [facility] Student asked if mom was interested in latching baby to the breast. Mom declined and said she planned on giving baby a bottle in the time being and would latch baby later.  [facility] Student discussed the importance of breast stimulation and latching baby for establishing milk supply. Mom was receptive and understood   Maternal Data    Feeding Mother's Current Feeding Choice: Breast Milk and Formula    Lactation Tools Discussed/Used    Interventions    Discharge    Consult Status Consult Status: Follow-up Date: 10/05/20 Follow-up type: In-patient    Gwyneth Fernandez Terrilee Croak 10/04/2020, 10:28 PM

## 2020-10-04 NOTE — Progress Notes (Signed)
Susan Bean is a 25 y.o. G2P1001 at [redacted]w[redacted]d by ultrasound admitted for active labor  Strip Note  Objective: BP 118/71   Pulse 81   Temp 98.1 F (36.7 C) (Oral)   Resp 16   Ht 5\' 4"  (1.626 m)   Wt 122.3 kg   LMP 12/28/2019 (Exact Date)   SpO2 96%   BMI 46.28 kg/m   FHT:  FHR: 140 bpm, variability: moderate,  accelerations:  Present,  decelerations:  Absent UC:   regular, every 2 minutes SVE:   Dilation: 8 Effacement (%): 80 Station: -2 Exam by:: [email or web address] RN  Labs: Lab Results  Component Value Date   WBC 13.6 (H) 10/04/2020   HGB 7.4 (L) 10/04/2020   HCT 27.8 (L) 10/04/2020   MCV 63.2 (L) 10/04/2020   PLT 381 10/04/2020   Assessment / Plan: Spontaneous labor, progressing normally  Labor: Progressing normally Fetal Wellbeing:  Category I Pain Control:  Epidural I/D:  GBS positive Anticipated MOD:  NSVD  10/06/2020 Doyle Kunath 10/04/2020, 5:41 PM

## 2020-10-04 NOTE — MAU Note (Signed)
Susan Bean is a 25 y.o. at [redacted]w[redacted]d here in MAU reporting: contractions since 0600, they are now about every 4 minutes. No bleeding, no LOF.   Onset of complaint: today  Pain score: 5/10  Vitals:   10/04/20 0950  BP: 118/70  Pulse: 91  Resp: 16  Temp: 98 F (36.7 C)  SpO2: 99%     FHT: +FM  Lab orders placed from triage: none

## 2020-10-04 NOTE — Discharge Summary (Addendum)
Postpartum Discharge Summary    Patient Name: Susan Bean DOB: 1995/07/19 MRN: [id number]  Date of admission: 10/04/2020 Delivery date:10/04/2020  Delivering provider: Randa Ngo  Date of discharge: 10/06/2020  Admitting diagnosis: Supervision of high risk pregnancy, antepartum [O09.90] Intrauterine pregnancy: [redacted]w[redacted]d    Secondary diagnosis:  Principal Problem:   Vaginal delivery Active Problems:   History of shoulder dystocia in prior pregnancy   Limited prenatal care   Anemia  Additional problems: as noted above    Discharge diagnosis: Term Pregnancy Delivered                                              Post partum procedures:blood transfusion and IV venofer, rhogam Augmentation: none Complications: None  Hospital course: Onset of Labor With Vaginal Delivery      25y.o. yo G2P1001 at 370w5das admitted in Latent Labor on 10/04/2020. Patient had an uncomplicated labor course as follows:  Membrane Rupture Time/Date: 2:15 PM ,10/04/2020   Delivery Method:Vaginal, Spontaneous  Episiotomy: None  Lacerations:  1st degree;Perineal  Patient had an uncomplicated postpartum course.  She received IV iron for admission Hgb of 7.4. She is ambulating, tolerating a regular diet, passing flatus, and urinating well. Patient is discharged home in stable condition on 10/06/20.  Newborn Data: Birth date:10/04/2020  Birth time:9:11 PM  Gender:Female  Living status:Living  Apgars:8 ,9  Weight:3430 g   Magnesium Sulfate received: No BMZ received: No Rhophylac:Yes MMR:N/A T-DaP:Given prenatally Flu: No  Transfusion:Yes-IV iron (venofer) and blood  Physical exam  Vitals:   10/05/20 0600 10/05/20 1147 10/05/20 1353 10/05/20 2200  BP: 107/65 117/62 111/67 108/60  Pulse: 80 68 85 81  Resp: '17 18 18 18  ' Temp: 98.2 F (36.8 C) 98 F (36.7 C) 98 F (36.7 C) 98.4 F (36.9 C)  TempSrc: Oral Oral Oral Oral  SpO2:  99% 98%   Weight:      Height:       General: alert,  cooperative and no distress Lochia: appropriate Uterine Fundus: firm Incision: N/A DVT Evaluation: No evidence of DVT seen on physical exam. Labs: Lab Results  Component Value Date   WBC 19.8 (H) 10/05/2020   HGB 6.7 (LL) 10/05/2020   HCT 24.6 (L) 10/05/2020   MCV 62.1 (L) 10/05/2020   PLT 381 10/05/2020   No flowsheet data found. Edinburgh Score: Edinburgh Postnatal Depression Scale Screening Tool 10/06/2020  I have been able to laugh and see the funny side of things. 0  I have looked forward with enjoyment to things. 0  I have blamed myself unnecessarily when things went wrong. 1  I have been anxious or worried for no good reason. 0  I have felt scared or panicky for no good reason. 1  Things have been getting on top of me. 0  I have been so unhappy that I have had difficulty sleeping. 0  I have felt sad or miserable. 0  I have been so unhappy that I have been crying. 0  The thought of harming myself has occurred to me. 0  Edinburgh Postnatal Depression Scale Total 2     After visit meds:  Allergies as of 10/06/2020   No Known Allergies     Medication List    TAKE these medications   ferrous sulfate 325 (65 FE) MG tablet Take 1 tablet (  325 mg total) by mouth every other day.   prenatal multivitamin Tabs tablet Take 1 tablet by mouth daily at 12 noon.        Discharge home in stable condition Infant Feeding: Breast Infant Disposition:home with mother Discharge instruction: per After Visit Summary and Postpartum booklet. Activity: Advance as tolerated. Pelvic rest for 6 weeks.  Diet: routine diet Future Appointments: Future Appointments  Date Time Provider [place]  11/15/2020  8:30 AM [facility] None  11/15/2020  9:00 AM Constant, Vickii Chafe, MD [facility] None   Follow up Visit: Message sent to [facility] by Dr. Astrid Drafts  Please schedule this patient for a In person postpartum visit in 6 weeks with the following provider: Any provider. 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 I agree with the findings and the plan of care as documented in the resident's note.  Arrie Senate, MD OB Fellow, [place] [facility] [place] 10/06/2020 8:27 AM

## 2020-10-04 NOTE — H&P (Addendum)
OBSTETRIC ADMISSION HISTORY AND PHYSICAL  Susan Bean is a 25 y.o. female G2P1001 with IUP at [redacted]w[redacted]d by U/S presenting in spontaneous onset of labor. Contractions started this morning and awoke her from sleep. She reports +FMs, No LOF, no VB, no blurry vision, headaches or peripheral edema, and RUQ pain.  She plans on breast feeding. She is undecided on birth control.  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 Medical history non-contributory    Past Surgical History:  Procedure Laterality Date  . NO PAST SURGERIES     OB History     Gravida  2   Para  1   Term  1   Preterm      AB      Living  1      SAB      IAB      Ectopic      Multiple  0   Live Births  1          Social History   Socioeconomic History  . Marital status: Single    Spouse name: Not on file  . Number of children: 1  . Years of education: Not on file  . Highest education level: Not on file  Occupational History  . Occupation: [employer]  Tobacco Use  . Smoking status: Never Smoker  . Smokeless tobacco: Never Used  Vaping Use  . Vaping Use: Never used  Substance and Sexual Activity  . Alcohol use: Never  . Drug use: Never  . Sexual activity: Yes  Other Topics Concern  . Not on file  Social History Narrative  . Not on file   Social Determinants of Health   Financial Resource Strain: Not on file  Food Insecurity: Not on file  Transportation Needs: Not on file  Physical Activity: Not on  file  Stress: Not on file  Social Connections: Not on file   Family History  Problem Relation Age of Onset  . Cancer Mother   . Arthritis Mother   . Diabetes Maternal Aunt   . Hypertension Maternal Aunt   . Arthritis Maternal Uncle   . Hypertension Maternal Uncle    No Known Allergies  Medications Prior to Admission  Medication Sig Dispense Refill Last Dose  . ferrous sulfate 325 (65 FE) MG tablet Take 1 tablet (325 mg total) by mouth every other day. 30 tablet 2 10/03/2020 at Unknown time  . prenatal vitamin w/FE, FA (PRENATAL 1 + 1) 27-1 MG TABS tablet Take 1 tablet by mouth daily at 12 noon.   10/03/2020 at Unknown time    Review of Systems  All systems reviewed and negative except as stated in HPI  Blood pressure 116/65, pulse 87, temperature 98 F (36.7 C), temperature source Oral, resp. rate 17, height 5\' 4"  (1.626 m), weight 122.3 kg, last menstrual period 12/28/2019, SpO2 98 %, unknown if currently breastfeeding. General appearance: alert and cooperative Lungs: clear to auscultation bilaterally Heart: regular rate and rhythm Abdomen: soft, non-tender;  bowel sounds normal Extremities: Homans sign is negative, no sign of DVT  Presentation: cephalic Fetal monitoring:  Baseline: 130 bpm, Variability: Good {> 6 bpm), Accelerations: Reactive, and Decelerations: Absent Uterine activity: Frequency - Every 3-4 minutes Dilation: 5 Effacement (%): 80 Station: -2,-3 Exam by:: Erle Crocker, RN  Prenatal labs: ABO, Rh: O/Negative/-- (10/19 1408) Antibody: Negative (10/19 1408) Rubella: 1.60 (10/19 1408) RPR: Non Reactive (03/09 1645)  HBsAg: Negative (10/19 1408)  HIV: Non Reactive (03/09 1645)  GBS: Positive/-- (03/09 0436)  1 hr Glucola  - A1c 5.8 (did not complete 2 hour GTT) Genetic screening  Silent alpha thal carrier Anatomy US wnl except echogenic cardiac focus  Prenatal Transfer Tool  Maternal Diabetes: No however A1c 5.8 (did not complete 2 hour GTT) Genetic  Screening: Normal Maternal Ultrasounds/Referrals: wnl except echogenic cardiac focus Fetal Ultrasounds or other Referrals:  None Maternal Substance Abuse:  No Significant Maternal Medications:  None Significant Maternal Lab Results: Group B Strep positive  No results found for this or any previous visit (from the past 24 hour(s)).  Patient Active Problem List   Diagnosis Date Noted  . [redacted] weeks gestation of pregnancy 09/15/2020  . Limited prenatal care 09/15/2020  . Obesity in pregnancy 09/15/2020  . Supervision of other normal pregnancy, antepartum 04/21/2020  . Chlamydia infection affecting pregnancy in third trimester 05/07/2019  . SVD (spontaneous vaginal delivery) 05/03/2019  . Shoulder dystocia during labor and delivery, delivered 05/03/2019  . Normal labor 05/02/2019    Assessment/Plan:  Susan Bean is a 26 y.o. G2P1001 at [redacted]w[redacted]d here for SOL  #Labor: SOL, continue expectant management. #Pain: Epidural upon request, IV pain meds #FWB: Cat I #ID:  GBS positive - Ampicillin ordered #MOF: Both #MOC:Undecided, counseled extensively on options #Circ:  Undecided   #Anemia, silent alpha carrier- Hgb 7.4 on admission. Plan for IV versus PO iron postpartum. #Rh negative- Rhogam evaluation postpartum. #H/o shoulder dystocia- birth weight of previous infant 3864g, resolved with McRoberts and suprapubic pressure. Most recent growth sono EFW 3273g (71st%ile) at 37.1 WGA, extrapolated to similar size as previous delivery ~3630g. Counseled by MFM OB about risk of recurrent shoulder dystocia. Discussed again today risk of shoulder dystocia as this baby is extrapolating to be a similar weight to prior, discussed that she could elect for Cesarean section. Discussed the risk of shoulder dystocia including clavicular fracture, humeral fracture, brachial plexus injury/Erb's palsy, need to go to OR if infant is stuck and unable to deliver vaginally, and fetal death. Discussed we cannot be sure  if she will have another shoulder dystocia but only way to avoid for sure would be Cesarean delivery. She states she is aware of these risks but elects to proceed with trial for vaginal delivery. #H/o chlamydia- TOC negative, most recent swabs negative 09/15/2020  Tilden Dome, MD  10/04/2020, 1:19 PM  Attestation of Supervision of Resident:  I confirm that I have verified the information documented in the resident's note and that I have also personally performed the history, physical exam and all medical decision making activities.  I have verified that all services and findings are accurately documented in this note; and I agree with management and plan as outlined in the documentation. I have also made any necessary editorial changes.  Counseled extensively on risk of recurrent shoulder dystocia by myself as added in the note above and elects for vaginal delivery.  Billey Co, MD Center for [facility], [facility] 10/04/2020 6:49 PM

## 2020-10-04 NOTE — Discharge Instructions (Signed)

## 2020-10-04 NOTE — Anesthesia Preprocedure Evaluation (Signed)
Anesthesia Evaluation  Patient identified by MRN, date of birth, ID band Patient awake    Reviewed: Allergy & Precautions, NPO status , Patient's Chart, lab work & pertinent test results  Airway Mallampati: III  TM Distance: >3 FB Neck ROM: Full    Dental  (+) Teeth Intact, Dental Advisory Given   Pulmonary neg pulmonary ROS,    Pulmonary exam normal breath sounds clear to auscultation       Cardiovascular negative cardio ROS Normal cardiovascular exam Rhythm:Regular Rate:Normal     Neuro/Psych negative neurological ROS     GI/Hepatic negative GI ROS, Neg liver ROS,   Endo/Other  Morbid obesity  Renal/GU negative Renal ROS     Musculoskeletal negative musculoskeletal ROS (+)   Abdominal   Peds  Hematology  (+) Blood dyscrasia, anemia , Plt 381k   Anesthesia Other Findings Day of surgery medications reviewed with the patient.  Reproductive/Obstetrics (+) Pregnancy                             Anesthesia Physical Anesthesia Plan  ASA: III  Anesthesia Plan: Epidural   Post-op Pain Management:    Induction:   PONV Risk Score and Plan: 2 and Treatment may vary due to age or medical condition  Airway Management Planned: Natural Airway  Additional Equipment:   Intra-op Plan:   Post-operative Plan:   Informed Consent: I have reviewed the patients History and Physical, chart, labs and discussed the procedure including the risks, benefits and alternatives for the proposed anesthesia with the patient or authorized representative who has indicated his/her understanding and acceptance.     Dental advisory given  Plan Discussed with:   Anesthesia Plan Comments: (Patient identified. Risks/Benefits/Options discussed with patient including but not limited to bleeding, infection, nerve damage, paralysis, failed block, incomplete pain control, headache, blood pressure changes, nausea,  vomiting, reactions to medication both or allergic, itching and postpartum back pain. Confirmed with bedside nurse the patient's most recent platelet count. Confirmed with patient that they are not currently taking any anticoagulation, have any bleeding history or any family history of bleeding disorders. Patient expressed understanding and wished to proceed. All questions were answered. )        Anesthesia Quick Evaluation

## 2020-10-05 ENCOUNTER — Inpatient Hospital Stay (HOSPITAL_COMMUNITY): Admission: RE | Admit: 2020-10-05 | Payer: Medicaid Other | Source: Ambulatory Visit

## 2020-10-05 ENCOUNTER — Other Ambulatory Visit (HOSPITAL_COMMUNITY): Payer: Medicaid Other

## 2020-10-05 DIAGNOSIS — D649 Anemia, unspecified: Secondary | ICD-10-CM

## 2020-10-05 DIAGNOSIS — O9903 Anemia complicating the puerperium: Secondary | ICD-10-CM

## 2020-10-05 LAB — RPR: RPR Ser Ql: NONREACTIVE

## 2020-10-05 LAB — CBC
HCT: 24.6 % — ABNORMAL LOW (ref 36.0–46.0)
Hemoglobin: 6.7 g/dL — CL (ref 12.0–15.0)
MCH: 16.9 pg — ABNORMAL LOW (ref 26.0–34.0)
MCHC: 27.2 g/dL — ABNORMAL LOW (ref 30.0–36.0)
MCV: 62.1 fL — ABNORMAL LOW (ref 80.0–100.0)
Platelets: 381 10*3/uL (ref 150–400)
RBC: 3.96 MIL/uL (ref 3.87–5.11)
RDW: 23.9 % — ABNORMAL HIGH (ref 11.5–15.5)
WBC: 19.8 10*3/uL — ABNORMAL HIGH (ref 4.0–10.5)
nRBC: 0.6 % — ABNORMAL HIGH (ref 0.0–0.2)

## 2020-10-05 LAB — GLUCOSE, CAPILLARY: Glucose-Capillary: 80 mg/dL (ref 70–99)

## 2020-10-05 MED ORDER — RHO D IMMUNE GLOBULIN 1500 UNIT/2ML IJ SOSY
300.0000 ug | PREFILLED_SYRINGE | Freq: Once | INTRAMUSCULAR | Status: AC
  Administered 2020-10-05: 300 ug via INTRAVENOUS
  Filled 2020-10-05: qty 2

## 2020-10-05 MED ORDER — PRENATAL MULTIVITAMIN CH
1.0000 | ORAL_TABLET | Freq: Every day | ORAL | Status: DC
  Administered 2020-10-05: 1 via ORAL
  Filled 2020-10-05: qty 1

## 2020-10-05 MED ORDER — ACETAMINOPHEN 325 MG PO TABS
650.0000 mg | ORAL_TABLET | Freq: Four times a day (QID) | ORAL | Status: DC
  Administered 2020-10-05 – 2020-10-06 (×4): 650 mg via ORAL
  Filled 2020-10-05 (×5): qty 2

## 2020-10-05 MED ORDER — DIPHENHYDRAMINE HCL 25 MG PO CAPS: 25.0000 mg | ORAL_CAPSULE | Freq: Four times a day (QID) | ORAL | Status: DC | PRN

## 2020-10-05 MED ORDER — ONDANSETRON HCL 4 MG/2ML IJ SOLN: 4.0000 mg | INTRAMUSCULAR | Status: DC | PRN

## 2020-10-05 MED ORDER — SIMETHICONE 80 MG PO CHEW: 80.0000 mg | CHEWABLE_TABLET | ORAL | Status: DC | PRN

## 2020-10-05 MED ORDER — TETANUS-DIPHTH-ACELL PERTUSSIS 5-2.5-18.5 LF-MCG/0.5 IM SUSY: 0.5000 mL | PREFILLED_SYRINGE | Freq: Once | INTRAMUSCULAR | Status: DC

## 2020-10-05 MED ORDER — ONDANSETRON HCL 4 MG PO TABS: 4.0000 mg | ORAL_TABLET | ORAL | Status: DC | PRN

## 2020-10-05 MED ORDER — SODIUM CHLORIDE 0.9 % IV SOLN
500.0000 mg | Freq: Once | INTRAVENOUS | Status: AC
  Administered 2020-10-05: 500 mg via INTRAVENOUS
  Filled 2020-10-05: qty 25

## 2020-10-05 MED ORDER — DIBUCAINE (PERIANAL) 1 % EX OINT: 1.0000 "application " | TOPICAL_OINTMENT | CUTANEOUS | Status: DC | PRN

## 2020-10-05 MED ORDER — BENZOCAINE-MENTHOL 20-0.5 % EX AERO
1.0000 "application " | INHALATION_SPRAY | CUTANEOUS | Status: DC | PRN
  Administered 2020-10-06: 1 via TOPICAL
  Filled 2020-10-05 (×2): qty 56

## 2020-10-05 MED ORDER — IBUPROFEN 600 MG PO TABS
600.0000 mg | ORAL_TABLET | Freq: Four times a day (QID) | ORAL | Status: DC
  Administered 2020-10-05 – 2020-10-06 (×4): 600 mg via ORAL
  Filled 2020-10-05 (×5): qty 1

## 2020-10-05 MED ORDER — WITCH HAZEL-GLYCERIN EX PADS: 1.0000 "application " | MEDICATED_PAD | CUTANEOUS | Status: DC | PRN

## 2020-10-05 MED ORDER — COCONUT OIL OIL: 1.0000 "application " | TOPICAL_OIL | Status: DC | PRN

## 2020-10-05 MED ORDER — SENNOSIDES-DOCUSATE SODIUM 8.6-50 MG PO TABS
2.0000 | ORAL_TABLET | Freq: Every day | ORAL | Status: DC
  Administered 2020-10-05 – 2020-10-06 (×2): 2 via ORAL
  Filled 2020-10-05 (×2): qty 2

## 2020-10-05 NOTE — Clinical Social Work Maternal (Signed)
CLINICAL SOCIAL WORK MATERNAL/CHILD NOTE  Patient Details  Name: Susan Bean MRN: [id number] Date of Birth: [birth year]  Date:  10/05/2020  Clinical Social Worker Initiating Note:  Darra Lis, MSW, [place] Date/Time: Initiated:  10/05/20/0915     Child's Name:  August Albino   Biological Parents:  Mother,Father (Victonio Lenn Sink 02/23/1992)   Need for Interpreter:  None   Reason for Referral:  Late or No Prenatal Care    Address:  [address] [place] [address]    Phone number:  [phone] (home)     Additional phone number:   Household Members/Support Persons (HM/SP):   Household Member/Support Person 1,Household Member/Support Person 2,Household Member/Support Person 3   HM/SP Name Relationship DOB or Age  HM/SP -54 Ameer Letitia Caul 05/03/2019  HM/SP -2 Vernie Ammons Sister 67  HM/SP -3 Jayla Valadez Sister 15  HM/SP -4        HM/SP -5        HM/SP -6        HM/SP -7        HM/SP -8          Natural Supports (not living in the home):  Extended Family   Professional Supports: None   Employment: Full-time   Type of Work: Counselling psychologist   Education:  [employer] school graduate   Homebound arranged:    Museum/gallery curator Resources:  [employer]   Other Resources:  Physicist, medical    Cultural/Religious Considerations Which May Impact Care:    Strengths:  Ability to meet basic needs ,Pediatrician chosen,Home prepared for child    Psychotropic Medications:         Pediatrician:    [place] area  Pediatrician List:   [facility] ([address])  [place]      Pediatrician Fax Number:    Risk Factors/Current Problems:  None   Cognitive State:  Insightful ,Linear Thinking ,Alert    Mood/Affect:  Interested ,Calm    CSW Assessment: CSW consulted for limited prenatal care. CSW met with MOB to complete assessment and offer  support. CSW introduced slef and role. CSW observed FOB in the process of leaving the room to complete birth certificate. Infant was observed sleeping on MOB. CSW informed MOB of the reason for consult. MOB reported she did not have a reason for having limited prenatal care. MOB stated she just did not attend. MOB denies transportation being an issue. CSW informed MOB of the hospital drug screen policy. MOB aware an UDS and CDS is performed on infant. CSW informed MOB a CPS report will be made if infant test positive for substances. MOB expressed understanding and denies any substance history or CPS history.   CSW discussed mental health history with MOB. MOB denies any mental health history and reported she is currently feeling well and had a good pregnancy. MOB stated she lives with her son and two sisters. MOB is employed full time and receives food stamp resources. MOB reported she plans to apply for [facility] and stated she has the contact information. MOB identified FOB and her cousin as supports. MOB denies any current SI, HI or being involved in DV.   CSW provided education regarding the baby blues period versus PPD and provided resources. CSW provided the New Mom Checklist and encouraged MOB to self evaluate and contact a medical professional if  symptoms are noted at any time.  CSW provided review of Sudden Infant Death Syndrome (SIDS) precautions. MOB reported she has all needs for infant, including a car seat. MOB has identified a pediatrician and denies any barriers to follow-up care. MOB declined any additional resources or referrals at this time.   CSW will continue to follow CDS/UDS and make a CPS report if warranted. CSW identifies no further need for intervention and no barriers to discharge at this time.  CSW Plan/Description:  No Further Intervention Required/No Barriers to [place] Will Continue to Monitor Umbilical Cord Tissue Drug Screen Results and Make Report if [facility]  Drug Screen Policy Information,Child Protective Service Report ,Perinatal Mood and Anxiety Disorder (PMADs) Education,Other Information/Referral to [employer] Resources,Sudden Infant Death Syndrome (SIDS) Education    Waylan Boga, [place] 10/05/2020, 10:05 AM

## 2020-10-05 NOTE — Progress Notes (Signed)
Discussed transfusion of pRBCs with patient after giving her one hour to consider. She said that she will get that transfusion but asked that we do it tomorrow morning (10/06/20).  She also noted that she has agreed to have her baby boy circumcised.    LOS: 1 day   Susan Bean 10/05/2020, 11:39 AM

## 2020-10-05 NOTE — Progress Notes (Signed)
POSTPARTUM PROGRESS NOTE  PPD #1  Subjective:  Susan Bean is a 25 y.o. G2P1001 s/p vaginal delivery at [redacted]w[redacted]d.  No acute events overnight. She reports she is doing well. She denies any problems with ambulating, voiding or po intake. Denies nausea or vomiting. She has passed flatus. Pain is well controlled.  Lochia is appropriate.  Objective: Blood pressure (!) 112/56, pulse 76, temperature 98.8 F (37.1 C), temperature source Oral, resp. rate 18, height 5\' 4"  (1.626 m), weight 122.3 kg, last menstrual period 12/28/2019, SpO2 96 %.  Physical Exam:  General: alert, cooperative and no distress Chest: no respiratory distress Heart:regular rate, distal pulses intact Abdomen: soft, nontender, gravid Uterine Fundus: firm, appropriately tender DVT Evaluation: No calf swelling or tenderness Extremities: 1+ LE edema bilaterally Skin: warm, dry  Recent Labs    10/04/20 1243 10/05/20 0514  HGB 7.4* 6.7*  HCT 27.8* 24.6*    Assessment/Plan: Susan Bean is a 25 y.o. G2P1001 s/p vaginal delivery at [redacted]w[redacted]d.  PPD#1- Doing well; pain well controlled.  Routine postpartum care  OOB, ambulated  Anemia: Hgb 7.4 on admission, receiving IV venofer. Appropriate lochia. AM CBC pending.   Hx elevated A1c w/o 2hr GTT: AM BG 80.  Rh IG: plan for rhogam workup  Limited prenatal care: social work consult placed Contraception: plans to f/u at postpartum visit Feeding: breast  Dispo: F/u on AM H/H and re-evaluate for discharge on PPD#2.   LOS: 1 day   [redacted]w[redacted]d, MS3 10/05/2020, 8:53 AM  Attestation of Supervision of Student:  I confirm that I have verified the information documented in the medical student's note and that I have also personally reperformed the history, physical exam and all medical decision making activities.  I have verified that all services and findings are accurately documented in this student's note; and I agree with management and plan as outlined in the  documentation. I have also made any necessary editorial changes.  10/07/2020, MD Center for [facility], [facility] 10/05/2020 8:54 AM

## 2020-10-05 NOTE — Progress Notes (Signed)
Patient with Hgb 6.4 on 10/05/20 labs. Discussed risks and benefits of pRBC transfusion with patient. She verbalized her understanding of risks and benefits and asked for one hour to consider the option.    LOS: 1 day   Susan Bean 10/05/2020, 9:21 AM

## 2020-10-05 NOTE — Anesthesia Postprocedure Evaluation (Signed)
Anesthesia Post Note  Patient: ATIRA BORELLO  Procedure(s) Performed: AN AD HOC LABOR EPIDURAL     Patient location during evaluation: Mother Baby Anesthesia Type: Epidural Level of consciousness: awake, awake and alert and oriented Pain management: pain level controlled Vital Signs Assessment: post-procedure vital signs reviewed and stable Respiratory status: spontaneous breathing and respiratory function stable Cardiovascular status: blood pressure returned to baseline Postop Assessment: no headache, epidural receding, patient able to bend at knees, adequate PO intake, no backache, no apparent nausea or vomiting and able to ambulate Anesthetic complications: no   No complications documented.  Last Vitals:  Vitals:   10/05/20 0400 10/05/20 0600  BP: 108/60 107/65  Pulse: 72 80  Resp: 18 17  Temp: 37.1 C 36.8 C  SpO2:      Last Pain:  Vitals:   10/05/20 0600  TempSrc: Oral  PainSc:    Pain Goal:                   Cleda Clarks

## 2020-10-05 NOTE — Progress Notes (Signed)
Pt up and ambulated to the bathroom.  States that legs a little weak. Used the steady for assistance.  Peri care given with instructions and return demonstration.  Pt back to bed without complaint

## 2020-10-06 ENCOUNTER — Other Ambulatory Visit (HOSPITAL_COMMUNITY): Payer: Medicaid Other

## 2020-10-06 DIAGNOSIS — O9903 Anemia complicating the puerperium: Secondary | ICD-10-CM | POA: Diagnosis not present

## 2020-10-06 DIAGNOSIS — D649 Anemia, unspecified: Secondary | ICD-10-CM | POA: Diagnosis not present

## 2020-10-06 LAB — RH IG WORKUP (INCLUDES ABO/RH)
ABO/RH(D): O NEG
Fetal Screen: NEGATIVE
Gestational Age(Wks): 38
Unit division: 0

## 2020-10-06 LAB — PREPARE RBC (CROSSMATCH)

## 2020-10-06 MED ORDER — SODIUM CHLORIDE 0.9% IV SOLUTION: Freq: Once | INTRAVENOUS | Status: DC

## 2020-10-07 ENCOUNTER — Inpatient Hospital Stay (HOSPITAL_COMMUNITY)
Admission: AD | Admit: 2020-10-07 | Payer: Medicaid Other | Source: Home / Self Care | Admitting: Obstetrics & Gynecology

## 2020-10-07 ENCOUNTER — Inpatient Hospital Stay (HOSPITAL_COMMUNITY): Payer: Medicaid Other

## 2020-10-07 LAB — TYPE AND SCREEN
ABO/RH(D): O NEG
Antibody Screen: POSITIVE
Unit division: 0
Unit division: 0

## 2020-10-07 LAB — BPAM RBC
Blood Product Expiration Date: 202204252359
Blood Product Expiration Date: 202204252359
ISSUE DATE / TIME: 202203300755
Unit Type and Rh: 9500
Unit Type and Rh: 9500

## 2020-11-08 ENCOUNTER — Ambulatory Visit: Payer: Medicaid Other | Admitting: Obstetrics and Gynecology

## 2020-11-13 ENCOUNTER — Inpatient Hospital Stay (HOSPITAL_COMMUNITY)
Admission: AD | Admit: 2020-11-13 | Discharge: 2020-11-13 | Disposition: A | Discharge status: Home / Self Care | Payer: Medicaid Other | Attending: Obstetrics & Gynecology | Admitting: Obstetrics & Gynecology

## 2020-11-13 ENCOUNTER — Encounter (HOSPITAL_COMMUNITY): Payer: Self-pay

## 2020-11-13 ENCOUNTER — Other Ambulatory Visit: Payer: Self-pay

## 2020-11-13 DIAGNOSIS — K649 Unspecified hemorrhoids: Secondary | ICD-10-CM | POA: Diagnosis present

## 2020-11-13 DIAGNOSIS — O872 Hemorrhoids in the puerperium: Secondary | ICD-10-CM | POA: Diagnosis not present

## 2020-11-13 NOTE — MAU Provider Note (Signed)
Chief Complaint:  Abdominal Pain and Hemorrhoids   Event Date/Time   First Provider Initiated Contact with Patient 11/13/20 1429     HPI: Susan Bean is a 25 y.o. G2P1001 at Unknown who presents to maternity admissions reporting hemorrhoids. Patient is PP SVD on 10/04/20. Patient reports that this morning she noticed a hemorrhoid. She endorses regular bowel movements, 2-3x/day that she does not have to strain with. She denies pain, constipation, diarrhea, or bloody stool. She reports 1 episode of lower abdominal cramping about 1 hour prior to arrival, however denies pain at this time. No vaginal bleeding. Patient has not been sexually active since prior to delivery nor has her menses resumed. She has an appointment at [facility] on Monday.   Pregnancy Course:   Past Medical History:  Diagnosis Date  . Medical history non-contributory    OB History  Gravida Para Term Preterm AB Living  2 1 1     1   SAB IAB Ectopic Multiple Live Births        0 1    # Outcome Date GA Lbr Len/2nd Weight Sex Delivery Anes PTL Lv  2 Gravida           1 Term 05/03/19 [redacted]w[redacted]d 19:20 / 02:08 3864 g M Vag-Spont EPI  LIV     Birth Comments: none     Complications: Shoulder Dystocia   Past Surgical History:  Procedure Laterality Date  . NO PAST SURGERIES     Family History  Problem Relation Age of Onset  . Cancer Mother   . Arthritis Mother   . Diabetes Maternal Aunt   . Hypertension Maternal Aunt   . Arthritis Maternal Uncle   . Hypertension Maternal Uncle    Social History   Tobacco Use  . Smoking status: Never Smoker  . Smokeless tobacco: Never Used  Vaping Use  . Vaping Use: Never used  Substance Use Topics  . Alcohol use: Never  . Drug use: Never   No Known Allergies Medications Prior to Admission  Medication Sig Dispense Refill Last Dose  . ferrous sulfate 325 (65 FE) MG tablet Take 1 tablet (325 mg total) by mouth every other day. 30 tablet 2   . Prenatal Vit-Fe Fumarate-FA (PRENATAL  MULTIVITAMIN) TABS tablet Take 1 tablet by mouth daily at 12 noon.       I have reviewed patient's Past Medical Hx, Surgical Hx, Family Hx, Social Hx, medications and allergies.   ROS:  Review of Systems  Constitutional: Negative.   Respiratory: Negative.   Cardiovascular: Negative.   Gastrointestinal: Negative for abdominal pain, blood in stool, constipation, diarrhea and rectal pain.       Positive hemorrhoids   Genitourinary: Negative.   Neurological: Negative.   Psychiatric/Behavioral: Negative.     Physical Exam   Patient Vitals for the past 24 hrs:  BP Temp Temp src Pulse Resp SpO2 Weight  11/13/20 1414 132/72 97.9 F (36.6 C) Oral 75 17 96 % 114.1 kg   Constitutional: well-developed, well-nourished female in no acute distress.  Cardiovascular: normal rate Respiratory: normal effort GI: Abd soft, non-tender, 1 small nontender external hemorrhoid does not appear thrombosed  MS: extremities nontender, no edema, normal ROM Neurologic: alert and oriented x 4.   Labs: No results found for this or any previous visit (from the past 24 hour(s)).  Imaging:  No results found.  MAU Course: No orders of the defined types were placed in this encounter.  No orders of the defined types  were placed in this encounter.   MDM: 1 small external hemorrhoid noted Recommend increase fiber and water intake. Avoid straining. May use stool softener if constipated or Tucks pads prn Keep appointment as scheduled on Monday Return to MAU as needed for any obstetrical emergencies  Assessment: 1. Hemorrhoids, unspecified hemorrhoid type     Plan: Discharge home in stable condition.  Keep appointment as scheduled on Monday Return to MAU as needed for any obstetrical emergencies   Allergies as of 11/13/2020   No Known Allergies     Medication List    TAKE these medications   ferrous sulfate 325 (65 FE) MG tablet Take 1 tablet (325 mg total) by mouth every other day.   prenatal  multivitamin Tabs tablet Take 1 tablet by mouth daily at 12 noon.       Camelia Eng, CNM 11/13/2020 3:01 PM

## 2020-11-13 NOTE — MAU Note (Signed)
Pt reports to mau with c/o lower abd cramping for the past hour.  Pt also complains of hemorrhoids that she just noticed this morning.  Pt reports it is painful to sit down.  Denies pregnancy.

## 2020-11-13 NOTE — Discharge Instructions (Signed)
Hemorrhoids Hemorrhoids are swollen veins in and around the rectum or anus. There are two types of hemorrhoids:  Internal hemorrhoids. These occur in the veins that are just inside the rectum. They may poke through to the outside and become irritated and painful.  External hemorrhoids. These occur in the veins that are outside the anus and can be felt as a painful swelling or hard lump near the anus. Most hemorrhoids do not cause serious problems, and they can be managed with home treatments such as diet and lifestyle changes. If home treatments do not help the symptoms, procedures can be done to shrink or remove the hemorrhoids. What are the causes? This condition is caused by increased pressure in the anal area. This pressure may result from various things, including:  Constipation.  Straining to have a bowel movement.  Diarrhea.  Pregnancy.  Obesity.  Sitting for long periods of time.  Heavy lifting or other activity that causes you to strain.  Anal sex.  Riding a bike for a long period of time. What are the signs or symptoms? Symptoms of this condition include:  Pain.  Anal itching or irritation.  Rectal bleeding.  Leakage of stool (feces).  Anal swelling.  One or more lumps around the anus. How is this diagnosed? This condition can often be diagnosed through a visual exam. Other exams or tests may also be done, such as:  An exam that involves feeling the rectal area with a gloved hand (digital rectal exam).  An exam of the anal canal that is done using a small tube (anoscope).  A blood test, if you have lost a significant amount of blood.  A test to look inside the colon using a flexible tube with a camera on the end (sigmoidoscopy or colonoscopy). How is this treated? This condition can usually be treated at home. However, various procedures may be done if dietary changes, lifestyle changes, and other home treatments do not help your symptoms. These  procedures can help make the hemorrhoids smaller or remove them completely. Some of these procedures involve surgery, and others do not. Common procedures include:  Rubber band ligation. Rubber bands are placed at the base of the hemorrhoids to cut off their blood supply.  Sclerotherapy. Medicine is injected into the hemorrhoids to shrink them.  Infrared coagulation. A type of light energy is used to get rid of the hemorrhoids.  Hemorrhoidectomy surgery. The hemorrhoids are surgically removed, and the veins that supply them are tied off.  Stapled hemorrhoidopexy surgery. The surgeon staples the base of the hemorrhoid to the rectal wall. Follow these instructions at home: Eating and drinking  Eat foods that have a lot of fiber in them, such as whole grains, beans, nuts, fruits, and vegetables.  Ask your health care provider about taking products that have added fiber (fiber supplements).  Reduce the amount of fat in your diet. You can do this by eating low-fat dairy products, eating less red meat, and avoiding processed foods.  Drink enough fluid to keep your urine pale yellow.   Managing pain and swelling  Take warm sitz baths for 20 minutes, 3-4 times a day to ease pain and discomfort. You may do this in a bathtub or using a portable sitz bath that fits over the toilet.  If directed, apply ice to the affected area. Using ice packs between sitz baths may be helpful. ? Put ice in a plastic bag. ? Place a towel between your skin and the bag. ? Leave   the ice on for 20 minutes, 2-3 times a day.   General instructions  Take over-the-counter and prescription medicines only as told by your health care provider.  Use medicated creams or suppositories as told.  Get regular exercise. Ask your health care provider how much and what kind of exercise is best for you. In general, you should do moderate exercise for at least 30 minutes on most days of the week (150 minutes each week). This can  include activities such as walking, biking, or yoga.  Go to the bathroom when you have the urge to have a bowel movement. Do not wait.  Avoid straining to have bowel movements.  Keep the anal area dry and clean. Use wet toilet paper or moist towelettes after a bowel movement.  Do not sit on the toilet for long periods of time. This increases blood pooling and pain.  Keep all follow-up visits as told by your health care provider. This is important. Contact a health care provider if you have:  Increasing pain and swelling that are not controlled by treatment or medicine.  Difficulty having a bowel movement, or you are unable to have a bowel movement.  Pain or inflammation outside the area of the hemorrhoids. Get help right away if you have:  Uncontrolled bleeding from your rectum. Summary  Hemorrhoids are swollen veins in and around the rectum or anus.  Most hemorrhoids can be managed with home treatments such as diet and lifestyle changes.  Taking warm sitz baths can help ease pain and discomfort.  In severe cases, procedures or surgery can be done to shrink or remove the hemorrhoids. This information is not intended to replace advice given to you by your health care provider. Make sure you discuss any questions you have with your health care provider. Document Revised: 11/22/2018 Document Reviewed: 11/15/2017 Elsevier Patient Education  2021 [employer].  Contraception Choices Contraception, also called birth control, refers to methods or devices that prevent pregnancy. Hormonal methods Contraceptive implant A contraceptive implant is a thin, plastic tube that contains a hormone that prevents pregnancy. It is different from an intrauterine device (IUD). It is inserted into the upper part of the arm by a health care provider. Implants can be effective for up to 3 years. Progestin-only injections Progestin-only injections are injections of progestin, a synthetic form of the  hormone progesterone. They are given every 3 months by a health care provider. Birth control pills Birth control pills are pills that contain hormones that prevent pregnancy. They must be taken once a day, preferably at the same time each day. A prescription is needed to use this method of contraception. Birth control patch The birth control patch contains hormones that prevent pregnancy. It is placed on the skin and must be changed once a week for three weeks and removed on the fourth week. A prescription is needed to use this method of contraception. Vaginal ring A vaginal ring contains hormones that prevent pregnancy. It is placed in the vagina for three weeks and removed on the fourth week. After that, the process is repeated with a new ring. A prescription is needed to use this method of contraception. Emergency contraceptive Emergency contraceptives prevent pregnancy after unprotected sex. They come in pill form and can be taken up to 5 days after sex. They work best the sooner they are taken after having sex. Most emergency contraceptives are available without a prescription. This method should not be used as your only form of birth control.  Barrier methods Female condom A female condom is a thin sheath that is worn over the penis during sex. Condoms keep sperm from going inside a woman's body. They can be used with a sperm-killing substance (spermicide) to increase their effectiveness. They should be thrown away after one use. Female condom A female condom is a soft, loose-fitting sheath that is put into the vagina before sex. The condom keeps sperm from going inside a woman's body. They should be thrown away after one use. Diaphragm A diaphragm is a soft, dome-shaped barrier. It is inserted into the vagina before sex, along with a spermicide. The diaphragm blocks sperm from entering the uterus, and the spermicide kills sperm. A diaphragm should be left in the vagina for 6-8 hours after sex and  removed within 24 hours. A diaphragm is prescribed and fitted by a health care provider. A diaphragm should be replaced every 1-2 years, after giving birth, after gaining more than 15 lb (6.8 kg), and after pelvic surgery. Cervical cap A cervical cap is a round, soft latex or plastic cup that fits over the cervix. It is inserted into the vagina before sex, along with spermicide. It blocks sperm from entering the uterus. The cap should be left in place for 6-8 hours after sex and removed within 48 hours. A cervical cap must be prescribed and fitted by a health care provider. It should be replaced every 2 years. Sponge A sponge is a soft, circular piece of polyurethane foam with spermicide in it. The sponge helps block sperm from entering the uterus, and the spermicide kills sperm. To use it, you make it wet and then insert it into the vagina. It should be inserted before sex, left in for at least 6 hours after sex, and removed and thrown away within 30 hours. Spermicides Spermicides are chemicals that kill or block sperm from entering the cervix and uterus. They can come as a cream, jelly, suppository, foam, or tablet. A spermicide should be inserted into the vagina with an applicator at least 10-15 minutes before sex to allow time for it to work. The process must be repeated every time you have sex. Spermicides do not require a prescription.   Intrauterine contraception Intrauterine device (IUD) An IUD is a T-shaped device that is put in a woman's uterus. There are two types:  Hormone IUD.This type contains progestin, a synthetic form of the hormone progesterone. This type can stay in place for 3-5 years.  Copper IUD.This type is wrapped in copper wire. It can stay in place for 10 years. Permanent methods of contraception Female tubal ligation In this method, a woman's fallopian tubes are sealed, tied, or blocked during surgery to prevent eggs from traveling to the uterus. Hysteroscopic  sterilization In this method, a small, flexible insert is placed into each fallopian tube. The inserts cause scar tissue to form in the fallopian tubes and block them, so sperm cannot reach an egg. The procedure takes about 3 months to be effective. Another form of birth control must be used during those 3 months. Female sterilization This is a procedure to tie off the tubes that carry sperm (vasectomy). After the procedure, the man can still ejaculate fluid (semen). Another form of birth control must be used for 3 months after the procedure. Natural planning methods Natural family planning In this method, a couple does not have sex on days when the woman could become pregnant. Calendar method In this method, the woman keeps track of the length of  each menstrual cycle, identifies the days when pregnancy can happen, and does not have sex on those days. Ovulation method In this method, a couple avoids sex during ovulation. Symptothermal method This method involves not having sex during ovulation. The woman typically checks for ovulation by watching changes in her temperature and in the consistency of cervical mucus. Post-ovulation method In this method, a couple waits to have sex until after ovulation. Where to find more information  Centers for Disease Control and Prevention: [employer] Summary  Contraception, also called birth control, refers to methods or devices that prevent pregnancy.  Hormonal methods of contraception include implants, injections, pills, patches, vaginal rings, and emergency contraceptives.  Barrier methods of contraception can include female condoms, female condoms, diaphragms, cervical caps, sponges, and spermicides.  There are two types of IUDs (intrauterine devices). An IUD can be put in a woman's uterus to prevent pregnancy for 3-5 years.  Permanent sterilization can be done through a procedure for males and females. Natural family planning methods involve nothaving  sex on days when the woman could become pregnant. This information is not intended to replace advice given to you by your health care provider. Make sure you discuss any questions you have with your health care provider. Document Revised: 12/01/2019 Document Reviewed: 12/01/2019 Elsevier Patient Education  2021 [employer].

## 2020-11-15 ENCOUNTER — Other Ambulatory Visit: Payer: Medicaid Other

## 2020-11-15 ENCOUNTER — Ambulatory Visit: Payer: Medicaid Other | Admitting: Obstetrics and Gynecology

## 2020-11-23 ENCOUNTER — Ambulatory Visit: Payer: Medicaid Other | Admitting: Obstetrics

## 2020-12-22 ENCOUNTER — Emergency Department (HOSPITAL_BASED_OUTPATIENT_CLINIC_OR_DEPARTMENT_OTHER)
Admission: EM | Admit: 2020-12-22 | Discharge: 2020-12-22 | Disposition: A | Discharge status: Home / Self Care | Payer: Medicaid Other | Attending: Emergency Medicine | Admitting: Emergency Medicine

## 2020-12-22 ENCOUNTER — Encounter (HOSPITAL_BASED_OUTPATIENT_CLINIC_OR_DEPARTMENT_OTHER): Payer: Self-pay | Admitting: *Deleted

## 2020-12-22 ENCOUNTER — Other Ambulatory Visit: Payer: Self-pay

## 2020-12-22 ENCOUNTER — Ambulatory Visit
Admission: EM | Admit: 2020-12-22 | Discharge: 2020-12-22 | Discharge status: Absent without leave | Payer: Medicaid Other

## 2020-12-22 DIAGNOSIS — R8281 Pyuria: Secondary | ICD-10-CM | POA: Diagnosis not present

## 2020-12-22 DIAGNOSIS — R55 Syncope and collapse: Secondary | ICD-10-CM | POA: Diagnosis present

## 2020-12-22 DIAGNOSIS — R42 Dizziness and giddiness: Secondary | ICD-10-CM | POA: Insufficient documentation

## 2020-12-22 HISTORY — DX: Anemia, unspecified: D64.9

## 2020-12-22 LAB — BASIC METABOLIC PANEL
Anion gap: 7 (ref 5–15)
BUN: 7 mg/dL (ref 6–20)
CO2: 26 mmol/L (ref 22–32)
Calcium: 8.9 mg/dL (ref 8.9–10.3)
Chloride: 103 mmol/L (ref 98–111)
Creatinine, Ser: 0.59 mg/dL (ref 0.44–1.00)
GFR, Estimated: >60 mL/min (ref >60)
Glucose, Bld: 91 mg/dL (ref 70–99)
Potassium: 4.1 mmol/L (ref 3.5–5.1)
Sodium: 136 mmol/L (ref 135–145)

## 2020-12-22 LAB — URINALYSIS, ROUTINE W REFLEX MICROSCOPIC
Bilirubin Urine: NEGATIVE
Glucose, UA: NEGATIVE mg/dL
Ketones, ur: 15 mg/dL — AB
Nitrite: NEGATIVE
Protein, ur: NEGATIVE mg/dL
Specific Gravity, Urine: 1.015 (ref 1.005–1.030)
pH: 6 (ref 5.0–8.0)

## 2020-12-22 LAB — CBC
HCT: 36.6 % (ref 36.0–46.0)
Hemoglobin: 10.6 g/dL — ABNORMAL LOW (ref 12.0–15.0)
MCH: 21.3 pg — ABNORMAL LOW (ref 26.0–34.0)
MCHC: 29 g/dL — ABNORMAL LOW (ref 30.0–36.0)
MCV: 73.6 fL — ABNORMAL LOW (ref 80.0–100.0)
Platelets: 376 10*3/uL (ref 150–400)
RBC: 4.97 MIL/uL (ref 3.87–5.11)
RDW: 23.3 % — ABNORMAL HIGH (ref 11.5–15.5)
WBC: 6.7 10*3/uL (ref 4.0–10.5)
nRBC: 0 % (ref 0.0–0.2)

## 2020-12-22 LAB — URINALYSIS, MICROSCOPIC (REFLEX): WBC, UA: >50 WBC/hpf (ref 0–5)

## 2020-12-22 LAB — PREGNANCY, URINE: Preg Test, Ur: NEGATIVE

## 2020-12-22 NOTE — Discharge Instructions (Addendum)
Please read and follow all provided instructions.  Your diagnoses today include:  1. Near syncope     Tests performed today include: Blood cell counts (white, red, and platelets) -mild anemia but improved over the past several months Electrolytes  Kidney function test Urine test to check for infection - shows infection fighting cells in the urine Urine culture - pending EKG - no concerning findings Vital signs. See below for your results today.   Medications prescribed:  None  Take any prescribed medications only as directed.  Home care instructions:  Follow any educational materials contained in this packet.  BE VERY CAREFUL not to take multiple medicines containing Tylenol (also called acetaminophen). Doing so can lead to an overdose which can damage your liver and cause liver failure and possibly death.   Follow-up instructions: Please follow-up with your primary care provider in the next 3 days for further evaluation of your symptoms.   Return instructions:  Please return to the Emergency Department if you experience worsening symptoms.  Please return if you have any other emergent concerns.  Additional Information:  Your vital signs today were: BP (!) 125/91   Pulse 70   Temp 98.1 F (36.7 C) (Oral)   Resp (!) 24   Ht 5\' 4"  (1.626 m)   Wt 104.3 kg   LMP 11/30/2020   SpO2 100%   BMI 39.48 kg/m  If your blood pressure (BP) was elevated above 135/85 this visit, please have this repeated by your doctor within one month. --------------

## 2020-12-22 NOTE — ED Provider Notes (Signed)
[facility] EMERGENCY DEPARTMENT Provider Note   CSN: [id number] Arrival date & time: 12/22/20  1316     History Chief Complaint  Patient presents with   Dizziness    Susan Bean is a 25 y.o. female.  Patient with history of anemia, childbirth 3/22, required iron and blood transfusion at that time per her report --presents to the emergency department today for lightheadedness and near syncope.  Patient states that she has been in her normal state of health until this morning.  She states that she felt hungry and stood up from a sitting position and walk to the refrigerator.  When she opened the refrigerator she fell to the ground because she felt so lightheaded.  She then recovered.  She feels fine now.  She states that when she feels like this it is usually a sign that her iron level was low.  She is not currently on any supplements.  She denies any associated vomiting or diarrhea.  She states that she has not been drinking enough fluids.  She does have heavy menstrual periods, last period was 3 weeks ago.  No current vaginal bleeding.  No headache. Patient denies risk factors for pulmonary embolism including: unilateral leg swelling, history of DVT/PE/other blood clots, use of exogenous hormones, recent immobilizations, recent surgery, recent travel (>4hr segment), malignancy, hemoptysis.         Past Medical History:  Diagnosis Date   Anemia    Medical history non-contributory     Patient Active Problem List   Diagnosis Date Noted   Anemia 10/04/2020   [redacted] weeks gestation of pregnancy 09/15/2020   Limited prenatal care 09/15/2020   Obesity in pregnancy 09/15/2020   Supervision of other normal pregnancy, antepartum 04/21/2020   History of shoulder dystocia in prior pregnancy 05/07/2019   Vaginal delivery 05/03/2019   Normal labor 05/02/2019    Past Surgical History:  Procedure Laterality Date   NO PAST SURGERIES       OB History     Gravida  2   Para   1   Term  1   Preterm      AB      Living  1      SAB      IAB      Ectopic      Multiple  0   Live Births  1           Family History  Problem Relation Age of Onset   Cancer Mother    Arthritis Mother    Diabetes Maternal Aunt    Hypertension Maternal Aunt    Arthritis Maternal Uncle    Hypertension Maternal Uncle     Social History   Tobacco Use   Smoking status: Never   Smokeless tobacco: Never  Vaping Use   Vaping Use: Never used  Substance Use Topics   Alcohol use: Never   Drug use: Never    Home Medications Prior to Admission medications   Medication Sig Start Date End Date Taking? Authorizing Provider  ferrous sulfate 325 (65 FE) MG tablet Take 1 tablet (325 mg total) by mouth every other day. 09/26/20   Sheila Oats, MD  Prenatal Vit-Fe Fumarate-FA (PRENATAL MULTIVITAMIN) TABS tablet Take 1 tablet by mouth daily at 12 noon.    [provider]    Allergies    Patient has no known allergies.  Review of Systems   Review of Systems  Constitutional:  Negative for fever.  HENT:  Negative for rhinorrhea and sore throat.   Eyes:  Negative for redness.  Respiratory:  Negative for cough and shortness of breath.   Cardiovascular:  Negative for chest pain.  Gastrointestinal:  Negative for abdominal pain, diarrhea, nausea and vomiting.  Genitourinary:  Negative for dysuria, frequency, hematuria and urgency.  Musculoskeletal:  Negative for myalgias.  Skin:  Negative for rash.  Neurological:  Positive for dizziness, syncope and light-headedness. Negative for headaches.   Physical Exam Updated Vital Signs BP 115/74   Pulse 68   Temp 98.1 F (36.7 C) (Oral)   Resp 18   Ht 5\' 4"  (1.626 m)   Wt 104.3 kg   LMP 11/30/2020   SpO2 100%   BMI 39.48 kg/m   Physical Exam Vitals and nursing note reviewed.  Constitutional:      General: She is not in acute distress.    Appearance: She is well-developed.  HENT:     Head: Normocephalic  and atraumatic.     Right Ear: External ear normal.     Left Ear: External ear normal.     Nose: Nose normal.  Eyes:     Conjunctiva/sclera: Conjunctivae normal.  Cardiovascular:     Rate and Rhythm: Normal rate and regular rhythm.     Heart sounds: No murmur heard. Pulmonary:     Effort: No respiratory distress.     Breath sounds: No wheezing, rhonchi or rales.  Abdominal:     Palpations: Abdomen is soft.     Tenderness: There is no abdominal tenderness. There is no guarding or rebound.  Musculoskeletal:     Cervical back: Normal range of motion and neck supple.     Right lower leg: No edema.     Left lower leg: No edema.  Skin:    General: Skin is warm and dry.     Findings: No rash.  Neurological:     General: No focal deficit present.     Mental Status: She is alert. Mental status is at baseline.     Motor: No weakness.  Psychiatric:        Mood and Affect: Mood normal.    ED Results / Procedures / Treatments   Labs (all labs ordered are listed, but only abnormal results are displayed) Labs Reviewed  URINALYSIS, ROUTINE W REFLEX MICROSCOPIC - Abnormal; Notable for the following components:      Result Value   APPearance CLOUDY (*)    Hgb urine dipstick MODERATE (*)    Ketones, ur 15 (*)    Leukocytes,Ua MODERATE (*)    All other components within normal limits  URINALYSIS, MICROSCOPIC (REFLEX) - Abnormal; Notable for the following components:   Bacteria, UA MANY (*)    All other components within normal limits  CBC - Abnormal; Notable for the following components:   Hemoglobin 10.6 (*)    MCV 73.6 (*)    MCH 21.3 (*)    MCHC 29.0 (*)    RDW 23.3 (*)    All other components within normal limits  URINE CULTURE  PREGNANCY, URINE  BASIC METABOLIC PANEL    EKG EKG Interpretation  Date/Time:  Wednesday December 22 2020 13:39:00 EDT Ventricular Rate:  79 PR Interval:  176 QRS Duration: 86 QT Interval:  414 QTC Calculation: 475 R Axis:   50 Text  Interpretation: Sinus rhythm Low voltage, precordial leads Confirmed by [id number], Adam ([id number]) on 12/22/2020 1:50:17 PM  Radiology No results found.  Procedures Procedures   Medications Ordered  in ED Medications - No data to display  ED Course  I have reviewed the triage vital signs and the nursing notes.  Pertinent labs & imaging results that were available during my care of the patient were reviewed by me and considered in my medical decision making (see chart for details).  Patient seen and examined. EKG reviewed.  No sign of Brugada syndrome, WPW, prolonged QT, LVH, heart block or other arrhythmia.  Work-up initiated --we will obtain blood work due to history of anemia.  She looks well.  Vital signs reviewed and are as follows: BP 115/74   Pulse 68   Temp 98.1 F (36.7 C) (Oral)   Resp 18   Ht 5\' 4"  (1.626 m)   Wt 104.3 kg   LMP 11/30/2020   SpO2 100%   BMI 39.48 kg/m   3:03 PM patient reassessed.  She states that she feels good.  Lab work-up is reassuring.  She is mildly anemic, however this is improved from when she gave birth in March.  UA shows white blood cells, however confirmed with patient that she currently has no urinary symptoms whatsoever.  Urine culture will be sent.  She is eating and drinking well.  At this point, no indication for further work-up, treatment, or monitoring.  Plan for discharged home.  Patient encouraged to rest and hydrate well remainder of today.  Encouraged follow-up with her PCP with any persistent symptoms or return to the emergency department if she has additional episodes of passing out, chest pain, shortness of breath, or other concerns.     MDM Rules/Calculators/A&P                          Patient with near syncope earlier today.  No associated chest pain or shortness of breath.  EKG without concerning findings.  She is not pregnant.  No associated nausea or vomiting.  Urine culture sent due to UA with signs of infection.  However,  patient is asymptomatic.  If her urine culture is significant for growth, would treat if she develops symptoms, otherwise would just have her monitor.  I doubt that this is related to her near syncopal spell today.  She looks well, nontoxic.  No concern for pyelonephritis or other significant infection.   Final Clinical Impression(s) / ED Diagnoses Final diagnoses:  Near syncope  Pyuria    Rx / DC Orders ED Discharge Orders     None        April, PA-C 12/22/20 1506    12/24/20, DO 12/24/20 [phone]

## 2020-12-22 NOTE — ED Triage Notes (Signed)
C/o near syncope x 1 hr ago c/o dizziness , c/o anxiety, EMS called to home earlier today

## 2020-12-24 LAB — URINE CULTURE

## 2021-03-29 ENCOUNTER — Telehealth: Payer: Medicaid Other | Admitting: Nurse Practitioner

## 2021-03-29 DIAGNOSIS — J029 Acute pharyngitis, unspecified: Secondary | ICD-10-CM | POA: Diagnosis not present

## 2021-03-29 NOTE — Progress Notes (Signed)
E-Visit for Sore Throat  We are sorry that you are not feeling well.  Here is how we plan to help!  Your symptoms are consistent with the current symptoms of COVID-19. It is recommended to take another test today and tomorrow and if you do test positive to schedule a follow up with [place] or with your primary care doctor.   Your symptoms indicate a likely viral infection (Pharyngitis).   Pharyngitis is inflammation in the back of the throat which can cause a sore throat, scratchiness and sometimes difficulty swallowing.   Pharyngitis is typically caused by a respiratory virus and will just run its course.  Please keep in mind that your symptoms could last up to 10 days.  For throat pain, we recommend over the counter oral pain relief medications such as acetaminophen or aspirin, or anti-inflammatory medications such as ibuprofen or naproxen sodium.  Topical treatments such as oral throat lozenges or sprays may be used as needed.  Avoid close contact with loved ones, especially the very young and elderly.  Remember to wash your hands thoroughly throughout the day as this is the number one way to prevent the spread of infection and wipe down door knobs and counters with disinfectant.  After careful review of your answers, I would not recommend and antibiotic for your condition.  Antibiotics should not be used to treat conditions that we suspect are caused by viruses like the virus that causes the common cold or flu. However, some people can have Strep with atypical symptoms. You may need formal testing in clinic or office to confirm if your symptoms continue or worsen.  Providers prescribe antibiotics to treat infections caused by bacteria. Antibiotics are very powerful in treating bacterial infections when they are used properly.  To maintain their effectiveness, they should be used only when necessary.  Overuse of antibiotics has resulted in the development of super bugs that are resistant to treatment!     Home Care: Only take medications as instructed by your medical team. Do not drink alcohol while taking these medications. A steam or ultrasonic humidifier can help congestion.  You can place a towel over your head and breathe in the steam from hot water coming from a faucet. Avoid close contacts especially the very young and the elderly. Cover your mouth when you cough or sneeze. Always remember to wash your hands.  Get Help Right Away If: You develop worsening fever or throat pain. You develop a severe head ache or visual changes. Your symptoms persist after you have completed your treatment plan.  Make sure you Understand these instructions. Will watch your condition. Will get help right away if you are not doing well or get worse.   Thank you for choosing an e-visit.  Your e-visit answers were reviewed by a board certified advanced clinical practitioner to complete your personal care plan. Depending upon the condition, your plan could have included both over the counter or prescription medications.  Please review your pharmacy choice. Make sure the pharmacy is open so you can pick up prescription now. If there is a problem, you may contact your provider through [employer] and have the prescription routed to another pharmacy.  Your safety is important to [place]. If you have drug allergies check your prescription carefully.   For the next 24 hours you can use MyChart to ask questions about today's visit, request a non-urgent call back, or ask for a work or school excuse. You will get an email in the  next two days asking about your experience. I hope that your e-visit has been valuable and will speed your recovery.   I spent approximately 7 minutes reviewing the patient's history, current symptoms and coordinating their plan of care today.

## 2021-04-14 ENCOUNTER — Telehealth: Payer: Medicaid Other | Admitting: Emergency Medicine

## 2021-04-14 DIAGNOSIS — J329 Chronic sinusitis, unspecified: Secondary | ICD-10-CM | POA: Diagnosis not present

## 2021-04-14 DIAGNOSIS — B9789 Other viral agents as the cause of diseases classified elsewhere: Secondary | ICD-10-CM

## 2021-04-14 DIAGNOSIS — R0981 Nasal congestion: Secondary | ICD-10-CM

## 2021-04-14 MED ORDER — AZELASTINE HCL 0.1 % NA SOLN: 1.0000 | Freq: Two times a day (BID) | NASAL | 12 refills | Status: DC

## 2021-04-14 NOTE — Progress Notes (Signed)

## 2021-04-14 NOTE — Progress Notes (Signed)
I spent 5 minutes reviewing patient chart.

## 2021-06-22 ENCOUNTER — Telehealth: Payer: Medicaid Other | Admitting: Physician Assistant

## 2021-06-22 DIAGNOSIS — A084 Viral intestinal infection, unspecified: Secondary | ICD-10-CM | POA: Diagnosis not present

## 2021-06-22 MED ORDER — ONDANSETRON 4 MG PO TBDP
4.0000 mg | ORAL_TABLET | Freq: Three times a day (TID) | ORAL | 0 refills | Status: DC | PRN
Start: 2021-06-22 — End: 2022-06-20

## 2021-06-22 NOTE — Progress Notes (Signed)
I have spent 5 minutes in review of e-visit questionnaire, review and updating patient chart, medical decision making and response to patient.   Analy Bassford Cody Jhace Fennell, PA-C    

## 2021-06-22 NOTE — Progress Notes (Signed)

## 2021-06-28 ENCOUNTER — Telehealth: Payer: Medicaid Other | Admitting: Physician Assistant

## 2021-06-28 DIAGNOSIS — J069 Acute upper respiratory infection, unspecified: Secondary | ICD-10-CM

## 2021-06-28 MED ORDER — FLUTICASONE PROPIONATE 50 MCG/ACT NA SUSP: 2.0000 | Freq: Every day | NASAL | 0 refills | Status: DC

## 2021-06-28 MED ORDER — BENZONATATE 100 MG PO CAPS: 100.0000 mg | ORAL_CAPSULE | Freq: Three times a day (TID) | ORAL | 0 refills | Status: DC | PRN

## 2021-06-28 NOTE — Progress Notes (Signed)
I have spent 5 minutes in review of e-visit questionnaire, review and updating patient chart, medical decision making and response to patient.   Briasia Flinders Cody Sallyann Kinnaird, PA-C    

## 2021-06-28 NOTE — Progress Notes (Signed)

## 2021-11-30 ENCOUNTER — Other Ambulatory Visit: Payer: Self-pay

## 2021-11-30 ENCOUNTER — Emergency Department (HOSPITAL_BASED_OUTPATIENT_CLINIC_OR_DEPARTMENT_OTHER): Payer: Medicaid Other

## 2021-11-30 ENCOUNTER — Encounter (HOSPITAL_BASED_OUTPATIENT_CLINIC_OR_DEPARTMENT_OTHER): Payer: Self-pay | Admitting: Emergency Medicine

## 2021-11-30 ENCOUNTER — Emergency Department (HOSPITAL_BASED_OUTPATIENT_CLINIC_OR_DEPARTMENT_OTHER)
Admission: EM | Admit: 2021-11-30 | Discharge: 2021-11-30 | Disposition: A | Discharge status: Home / Self Care | Payer: Medicaid Other | Attending: Emergency Medicine | Admitting: Emergency Medicine

## 2021-11-30 DIAGNOSIS — M79641 Pain in right hand: Secondary | ICD-10-CM | POA: Diagnosis present

## 2021-11-30 IMAGING — CR DG HAND COMPLETE 3+V*R*
3 series · 3 of 3 positions shown · non-contrast
Comparison: None Available.

CLINICAL DATA: pain. Hit right hand to the wall when stretching
yesterday, presented with pain, swelling and tenderness in the ring
finger

EXAM:
RIGHT HAND - COMPLETE 3+ VIEW

[x hand pa right]
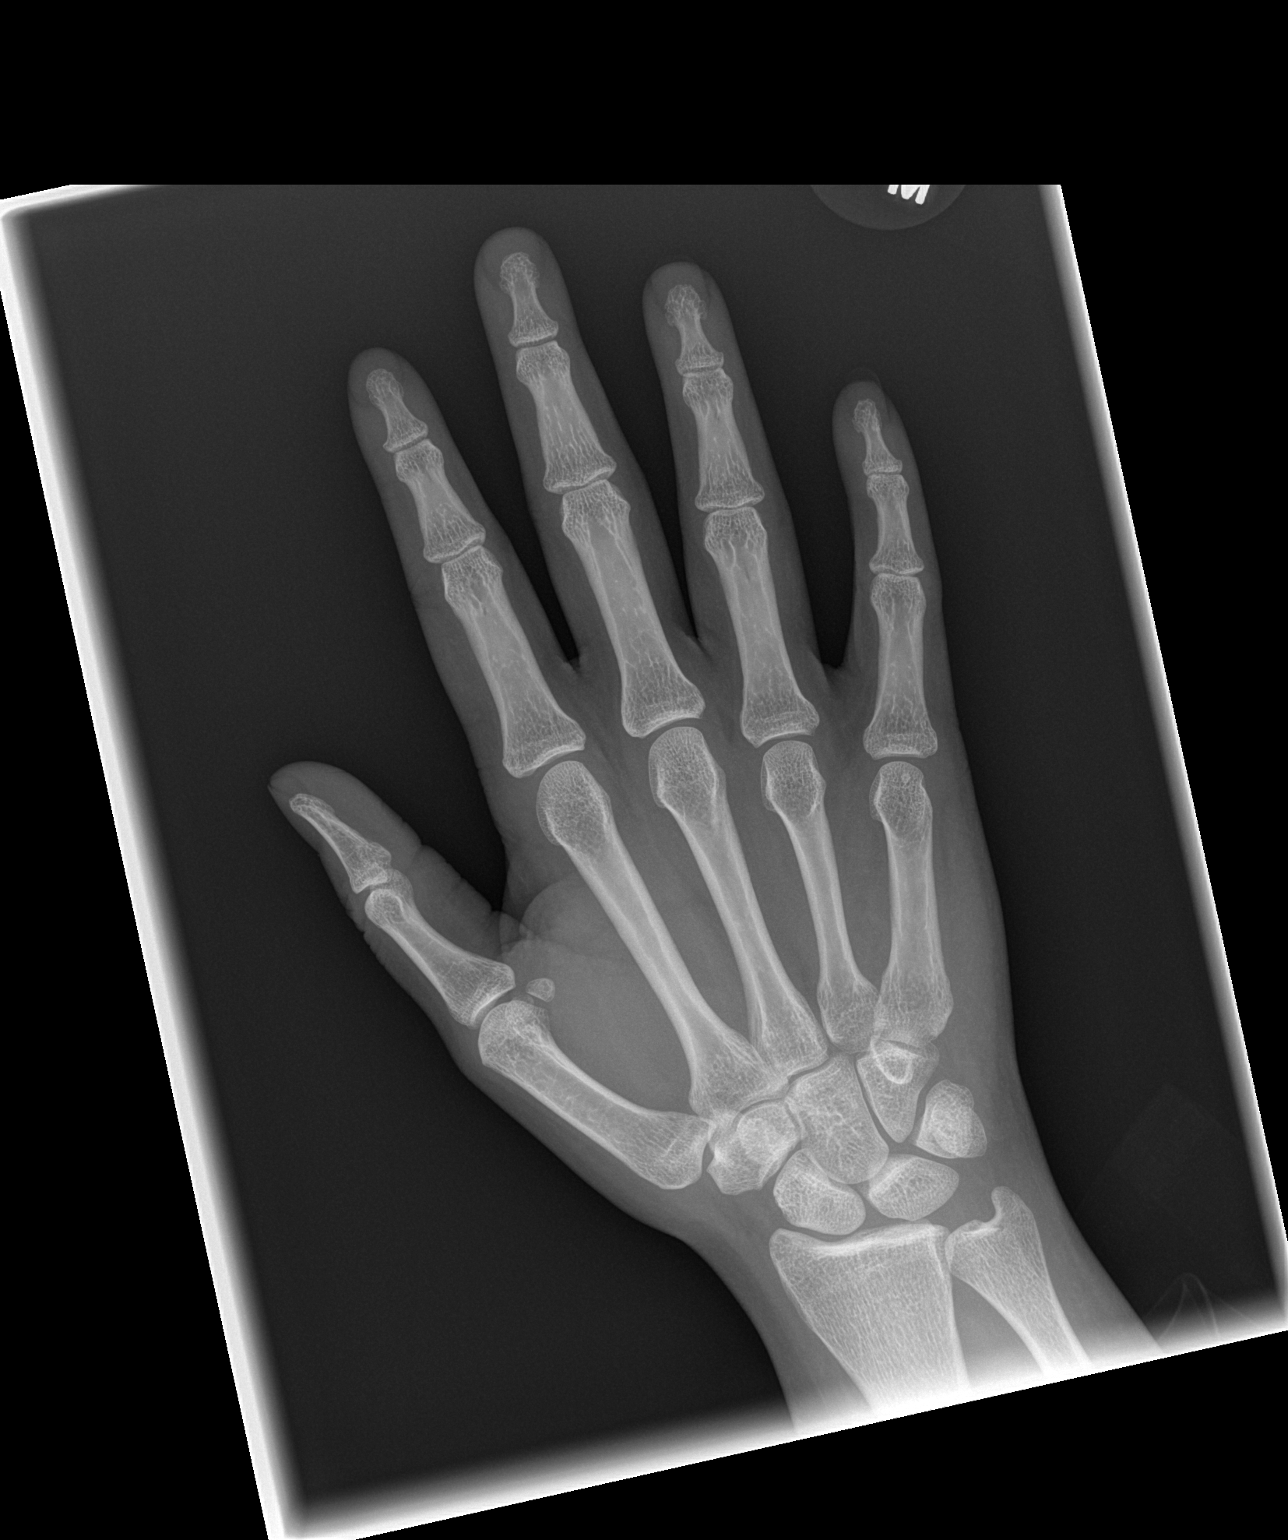

[x hand oblique right]
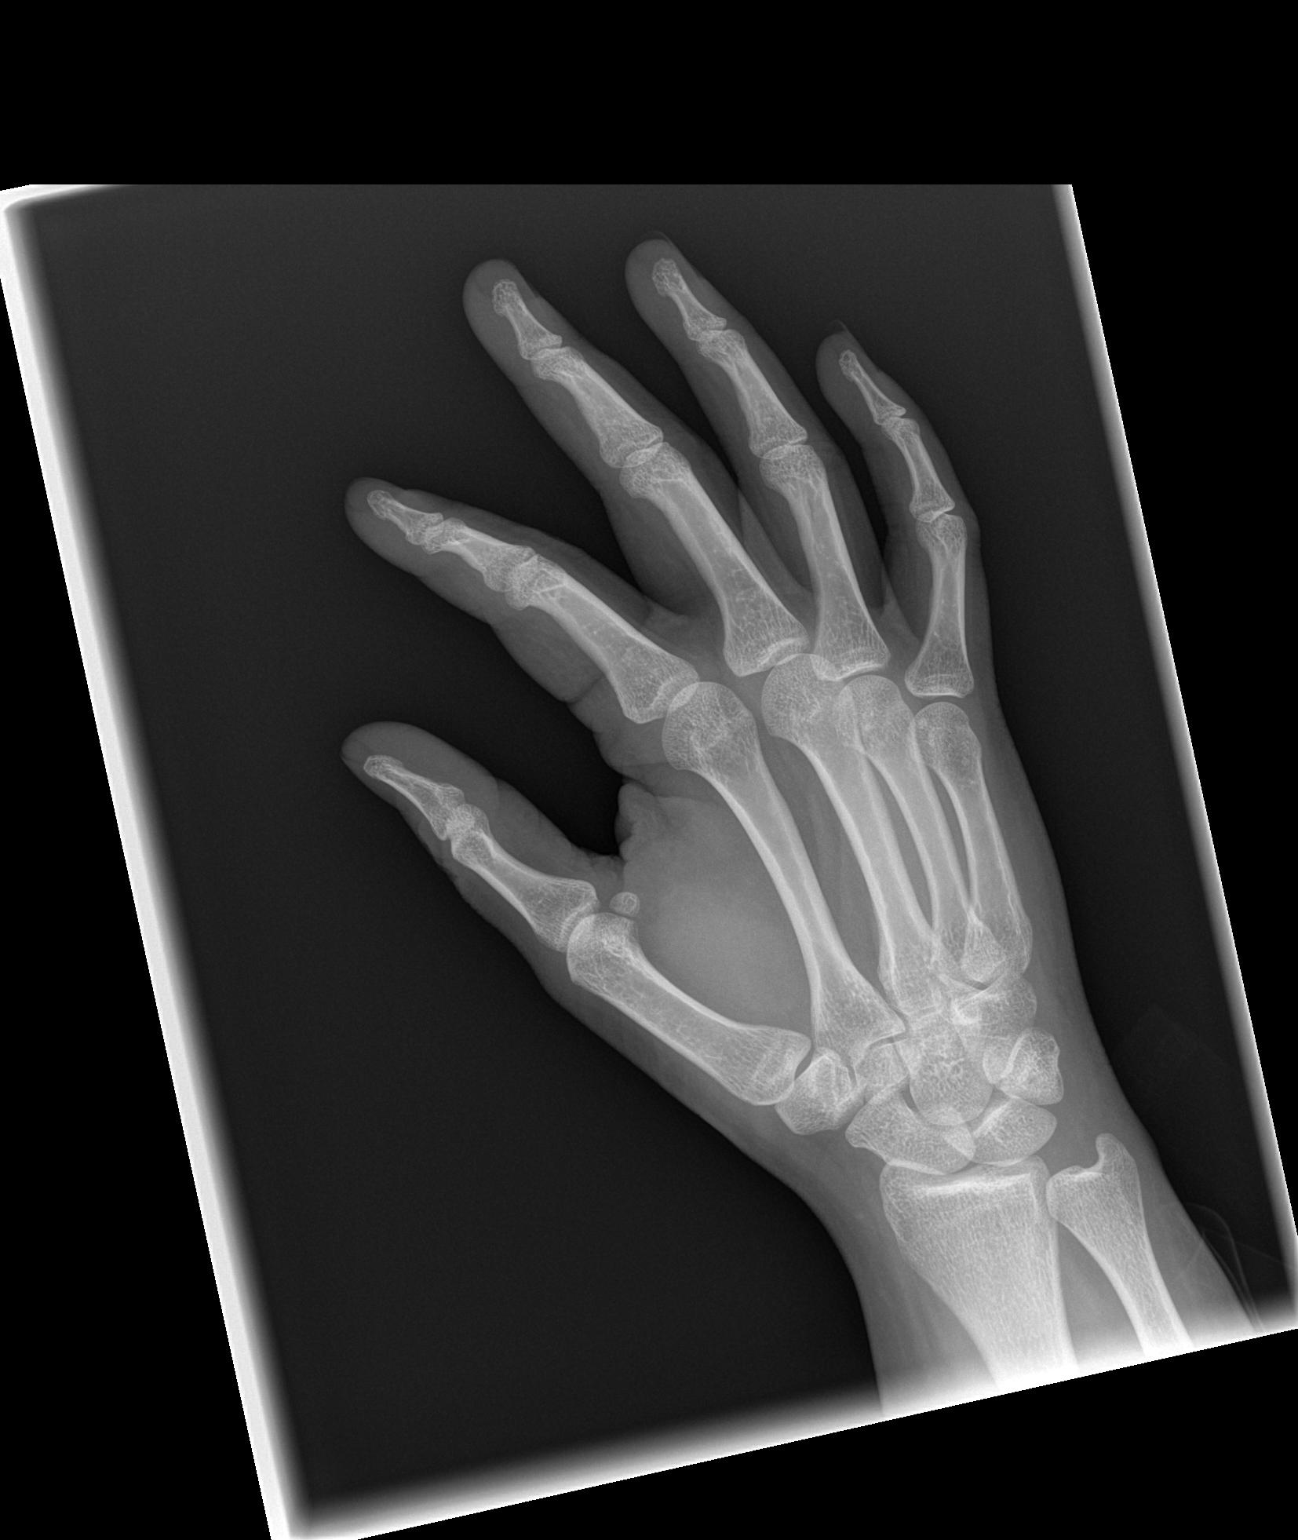

[x hand lat right]
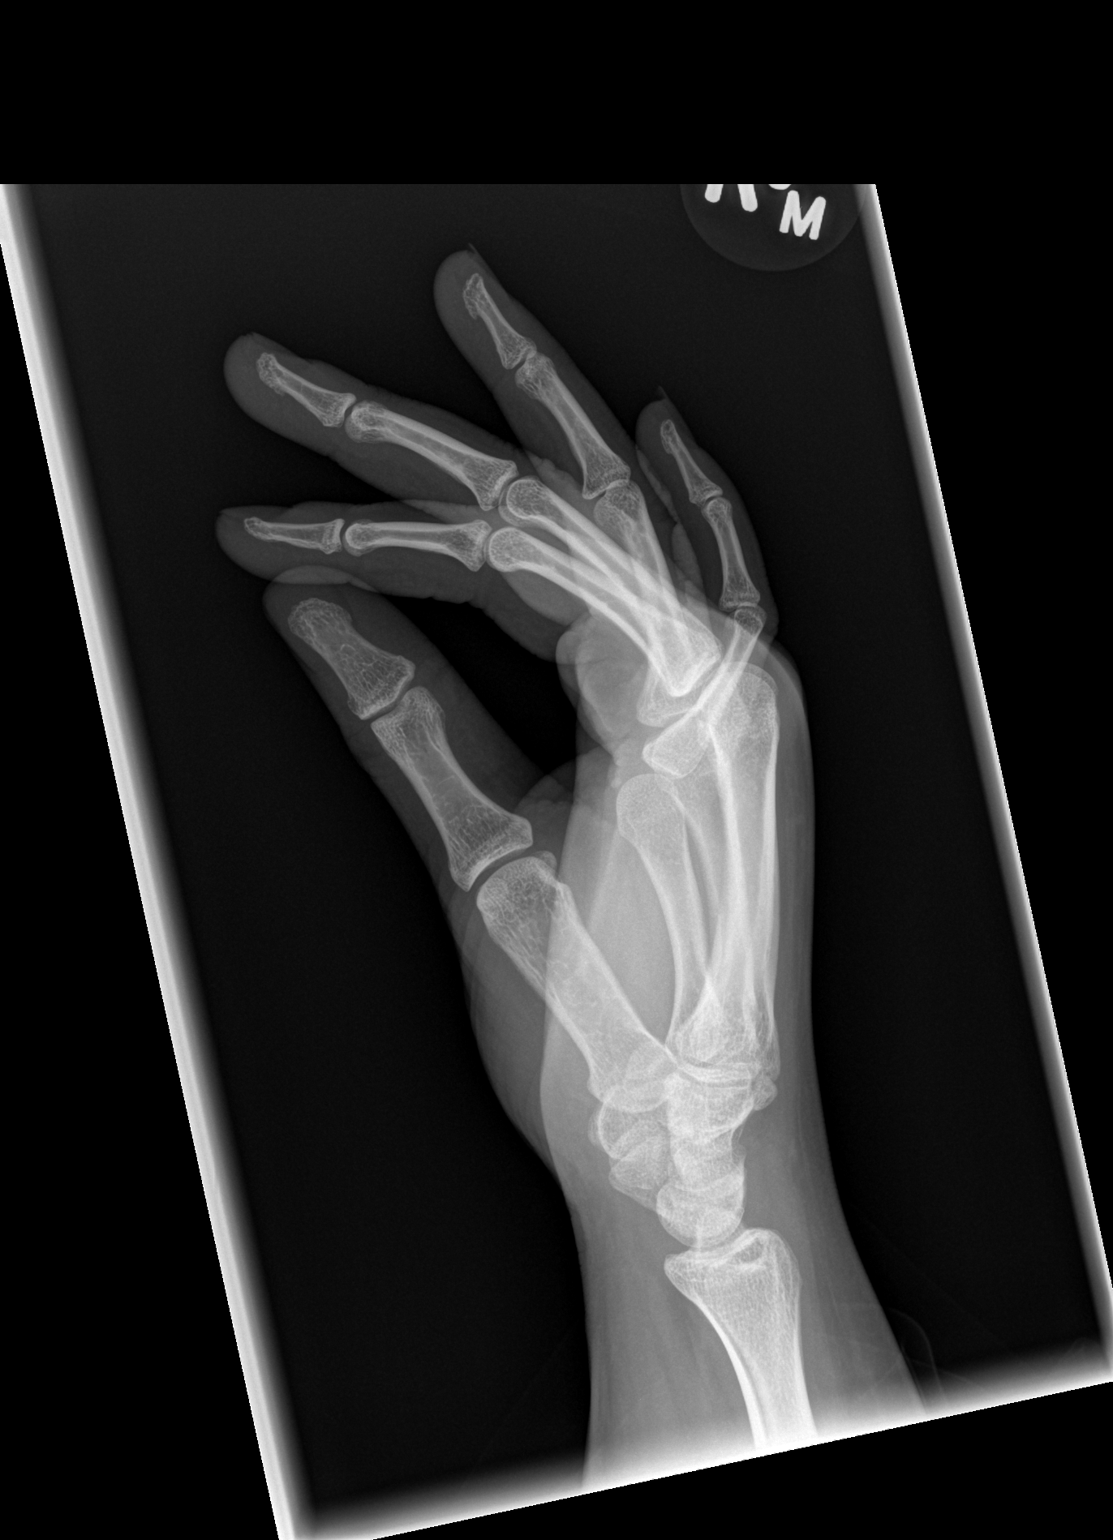

[3 of 3 positions shown; findings below may reference images not displayed]

FINDINGS: There is no evidence of fracture or dislocation. There is no
evidence of arthropathy or other focal bone abnormality. Soft
tissues are unremarkable.
IMPRESSION: Negative.

## 2021-11-30 MED ORDER — MELOXICAM 15 MG PO TABS: 15.0000 mg | ORAL_TABLET | Freq: Every day | ORAL | 0 refills | Status: AC

## 2021-11-30 NOTE — ED Triage Notes (Signed)
Hit rt hand on wall when stretching yesterday rt ring finger is swollen and tender , can wiggle fingers < 3 sec cap refill

## 2021-11-30 NOTE — Discharge Instructions (Signed)
You were seen in the emergency department today with finger pain after injury.  There is no broken bone or other bony injury on x-ray.  I am placing you in a finger splint but would encourage you to remove this frequently and continue moving your fingers to reduce stiffness and soreness.  I am starting you on some anti-inflammatory medication.  You should not take aspirin or ibuprofen/naproxen in addition to this medicine.  You may take Tylenol if needed for additional pain relief.  Please follow-up with your primary care doctor in the coming week.  Return with any new or suddenly worsening symptoms.

## 2021-11-30 NOTE — ED Provider Notes (Signed)
Emergency Department Provider Note   I have reviewed the triage vital signs and the nursing notes.   HISTORY  Chief Complaint Hand Injury   HPI Susan Bean is a 26 y.o. female with PMH reviewed below presents to the emergency department for evaluation of pain in the right ring finger after injury yesterday.  Patient states she was getting up from her desk and spreading her arms out to stretch when she struck the proximal portion of her right ring finger on the edge of her desk.  She denies punching anyone.  There is no break of the skin.  No numbness.  She states there is been some swelling and pain with both flexion and extension.  No pain into the hand or wrist.    Past Medical History:  Diagnosis Date   Anemia    Medical history non-contributory     Review of Systems  Constitutional: No fever/chills Cardiovascular: Denies chest pain. Respiratory: Denies shortness of breath. Gastrointestinal: No abdominal pain.  Musculoskeletal: Positive right ring finger pain.  Skin: Negative for rash. Neurological: Negative for numbness.   ____________________________________________   PHYSICAL EXAM:  VITAL SIGNS: ED Triage Vitals  Enc Vitals Group     BP 11/30/21 0904 (!) 124/91     Pulse Rate 11/30/21 0904 74     Resp 11/30/21 0904 16     Temp 11/30/21 0904 98.4 F (36.9 C)     Temp Source 11/30/21 0904 Oral     SpO2 11/30/21 0904 100 %     Weight 11/30/21 0900 200 lb (90.7 kg)     Height 11/30/21 0900 5\' 6"  (1.676 m)   Constitutional: Alert and oriented. Well appearing and in no acute distress. Eyes: Conjunctivae are normal.  Head: Atraumatic. Nose: No congestion/rhinnorhea. Mouth/Throat: Mucous membranes are moist.   Neck: No stridor.   Cardiovascular: Normal rate, regular rhythm. Good peripheral circulation. Grossly normal heart sounds.   Respiratory: Normal respiratory effort.  No retractions. Lungs CTAB. Gastrointestinal: Soft and nontender. No distention.   Musculoskeletal: No appreciable right ring finger swelling.  Some tenderness to the proximal right ring finger without cellulitis or abscess.  No laceration or abrasion to the hand.  No wrist tenderness on the right.  Neurologic:  Normal speech and language. Normal sensation to the distal right right finger and hand.  Skin:  Skin is warm, dry and intact. No rash noted.  ____________________________________________  RADIOLOGY  DG Hand Complete Right  Result Date: 11/30/2021 CLINICAL DATA:  pain. Hit right hand to the wall when stretching yesterday, presented with pain, swelling and tenderness in the ring finger EXAM: RIGHT HAND - COMPLETE 3+ VIEW COMPARISON:  None Available. FINDINGS: There is no evidence of fracture or dislocation. There is no evidence of arthropathy or other focal bone abnormality. Soft tissues are unremarkable. IMPRESSION: Negative. Electronically Signed   By: 12/02/2021 M.D.   On: 11/30/2021 09:17    ____________________________________________   PROCEDURES  Procedure(s) performed:   Procedures  None  ____________________________________________   INITIAL IMPRESSION / ASSESSMENT AND PLAN / ED COURSE  Pertinent labs & imaging results that were available during my care of the patient were reviewed by me and considered in my medical decision making (see chart for details).   This patient is Presenting for Evaluation of hand pain, which does require a range of treatment options, and is a complaint that involves a high risk of morbidity and mortality.  The Differential Diagnoses includes fracture, dislocation, cellulitis, tenosynovitis.  Radiologic Tests Ordered, included right hand x-ray. I independently interpreted the images and agree with radiology interpretation.   Medical Decision Making: Summary:  Patient presents emergency department with pain after minor trauma to the right ring finger.  I do not appreciate any findings consistent with tenosynovitis,  septic joint, or abscess.  No felon.  No dactylitis on exam.  Plan for plain films and if negative plan for anti-inflammatories.   Reevaluation with update and discussion with patient. Reviewed x-ray results. Plan for finger brace for comfort but advised frequent removal and ROM exercises. Mobic Rx sent to pharmacy with plan for 7 day PCP follow up.   Disposition: discharge  ____________________________________________  FINAL CLINICAL IMPRESSION(S) / ED DIAGNOSES  Final diagnoses:  Right hand pain     NEW OUTPATIENT MEDICATIONS STARTED DURING THIS VISIT:  New Prescriptions   MELOXICAM (MOBIC) 15 MG TABLET    Take 1 tablet (15 mg total) by mouth daily for 5 days.    Note:  This document was prepared using Dragon voice recognition software and may include unintentional dictation errors.  Alona Bene, MD, [facility]    [name], Arlyss Repress, MD 11/30/21 [phone]

## 2022-06-20 ENCOUNTER — Telehealth: Payer: Medicaid Other | Admitting: Physician Assistant

## 2022-06-20 DIAGNOSIS — J069 Acute upper respiratory infection, unspecified: Secondary | ICD-10-CM

## 2022-06-20 MED ORDER — FLUTICASONE PROPIONATE 50 MCG/ACT NA SUSP: 2.0000 | Freq: Every day | NASAL | 0 refills | Status: DC

## 2022-06-20 MED ORDER — BENZONATATE 100 MG PO CAPS: 100.0000 mg | ORAL_CAPSULE | Freq: Three times a day (TID) | ORAL | 0 refills | Status: AC | PRN

## 2022-06-20 NOTE — Progress Notes (Signed)
E-Visit for Upper Respiratory Infection   We are sorry you are not feeling well.  Here is how we plan to help!  Based on what you have shared with me, it looks like you may have a viral upper respiratory infection.  Upper respiratory infections are caused by a large number of viruses; however, rhinovirus is the most common cause.   Symptoms vary from person to person, with common symptoms including sore throat, cough, fatigue or lack of energy and feeling of general discomfort.  A low-grade fever of up to 100.4 may present, but is often uncommon.  Symptoms vary however, and are closely related to a person's age or underlying illnesses.  The most common symptoms associated with an upper respiratory infection are nasal discharge or congestion, cough, sneezing, headache and pressure in the ears and face.  These symptoms usually persist for about 3 to 10 days, but can last up to 2 weeks.  It is important to know that upper respiratory infections do not cause serious illness or complications in most cases.    Upper respiratory infections can be transmitted from person to person, with the most common method of transmission being a person's hands.  The virus is able to live on the skin and can infect other persons for up to 2 hours after direct contact.  Also, these can be transmitted when someone coughs or sneezes; thus, it is important to cover the mouth to reduce this risk.  To keep the spread of the illness at bay, good hand hygiene is very important.  This is an infection that is most likely caused by a virus. There are no specific treatments other than to help you with the symptoms until the infection runs its course.  We are sorry you are not feeling well.  Here is how we plan to help!   For nasal congestion, you may use an oral decongestants such as Mucinex D or if you have glaucoma or high blood pressure use plain Mucinex.  Saline nasal spray or nasal drops can help and can safely be used as often as  needed for congestion.  For your congestion, I have prescribed Fluticasone nasal spray one spray in each nostril twice a day  If you do not have a history of heart disease, hypertension, diabetes or thyroid disease, prostate/bladder issues or glaucoma, you may also use Sudafed to treat nasal congestion.  It is highly recommended that you consult with a pharmacist or your primary care physician to ensure this medication is safe for you to take.     If you have a cough, you may use cough suppressants such as Delsym and Robitussin.  If you have glaucoma or high blood pressure, you can also use Coricidin HBP.   For cough I have prescribed for you A prescription cough medication called Tessalon Perles 100 mg. You may take 1-2 capsules every 8 hours as needed for cough  If you have a sore or scratchy throat, use a saltwater gargle-  to  teaspoon of salt dissolved in a 4-ounce to 8-ounce glass of warm water.  Gargle the solution for approximately 15-30 seconds and then spit.  It is important not to swallow the solution.  You can also use throat lozenges/cough drops and Chloraseptic spray to help with throat pain or discomfort.  Warm or cold liquids can also be helpful in relieving throat pain.  For headache, pain or general discomfort, you can use Ibuprofen or Tylenol as directed.   Some authorities believe   that zinc sprays or the use of Echinacea may shorten the course of your symptoms.   HOME CARE Only take medications as instructed by your medical team. Be sure to drink plenty of fluids. Water is fine as well as fruit juices, sodas and electrolyte beverages. You may want to stay away from caffeine or alcohol. If you are nauseated, try taking small sips of liquids. How do you know if you are getting enough fluid? Your urine should be a pale yellow or almost colorless. Get rest. Taking a steamy shower or using a humidifier may help nasal congestion and ease sore throat pain. You can place a towel over  your head and breathe in the steam from hot water coming from a faucet. Using a saline nasal spray works much the same way. Cough drops, hard candies and sore throat lozenges may ease your cough. Avoid close contacts especially the very young and the elderly Cover your mouth if you cough or sneeze Always remember to wash your hands.   GET HELP RIGHT AWAY IF: You develop worsening fever. If your symptoms do not improve within 10 days You develop yellow or green discharge from your nose over 3 days. You have coughing fits You develop a severe head ache or visual changes. You develop shortness of breath, difficulty breathing or start having chest pain Your symptoms persist after you have completed your treatment plan  MAKE SURE YOU  Understand these instructions. Will watch your condition. Will get help right away if you are not doing well or get worse.  Thank you for choosing an e-visit.  Your e-visit answers were reviewed by a board certified advanced clinical practitioner to complete your personal care plan. Depending upon the condition, your plan could have included both over the counter or prescription medications.  Please review your pharmacy choice. Make sure the pharmacy is open so you can pick up prescription now. If there is a problem, you may contact your provider through MyChart messaging and have the prescription routed to another pharmacy.  Your safety is important to us. If you have drug allergies check your prescription carefully.   For the next 24 hours you can use MyChart to ask questions about today's visit, request a non-urgent call back, or ask for a work or school excuse. You will get an email in the next two days asking about your experience. I hope that your e-visit has been valuable and will speed your recovery.   I have spent 5 minutes in review of e-visit questionnaire, review and updating patient chart, medical decision making and response to patient.    Siarra Gilkerson M Jenniferlynn Saad, PA-C  

## 2022-07-10 NOTE — L&D Delivery Note (Addendum)
LABOR COURSE Susan Bean is a 27 y/o G3P2002 who initially presented for active labor. Initially reported that she was not aware she was pregnant. No prenatal care. Given one dose of ampicillin for GBS prophylaxis but came back GBS negative. Started on pitocin for augmentation. NICU in the room due to meconium stained amniotic fluid.   Delivery Note Called to room and patient was complete and pushing. Head delivered OA. No nuchal cord present. Shoulder and body delivered in usual fashion. At 1252 a viable female was delivered via Vaginal, Spontaneous (Presentation:OA;LOA).  Infant with spontaneous cry, placed on mother's abdomen, dried and stimulated. Cord clamped x 2 after 1-minute delay, and cut by grandmother of the baby. Cord blood drawn. Placenta delivered spontaneously with gentle cord traction. Appears intact. Fundus firm with massage and Pitocin. Labia, perineum, vagina, and cervix inspected with 1st degree perineal laceration found. Repaired with 3.0 vicryl in typical fasion.   APGAR: 8,9; weight 3771g .   Cord: 3VC with no complications   Anesthesia: Epidural Episiotomy: None Lacerations: 1st degree perineal Suture Repair: 3.0 vicryl Est. Blood Loss (mL): 106  Mom to postpartum.  Baby to Couplet care / Skin to Skin.  Cashion, Colter L, MD  1:21 PM    GME ATTESTATION:  Evaluation and management procedures were performed by the [facility] Resident under my supervision. I was immediately available and gloved for direct supervision, assistance and direction throughout this encounter.  I also confirm that I have verified the information documented in the resident's note, and that I have also personally reperformed the pertinent components of the physical exam and all of the medical decision making activities.  I have also made any necessary editorial changes.  Wyn Forster, MD OB Fellow, [facility], [facility] 03/25/2023 3:31 PM

## 2022-09-13 ENCOUNTER — Institutional Professional Consult (permissible substitution): Payer: Medicaid Other | Admitting: Plastic Surgery

## 2022-09-28 ENCOUNTER — Telehealth: Payer: Medicaid Other | Admitting: Physician Assistant

## 2022-09-28 DIAGNOSIS — B9789 Other viral agents as the cause of diseases classified elsewhere: Secondary | ICD-10-CM

## 2022-09-28 DIAGNOSIS — J019 Acute sinusitis, unspecified: Secondary | ICD-10-CM

## 2022-09-28 MED ORDER — IPRATROPIUM BROMIDE 0.03 % NA SOLN: 2.0000 | Freq: Two times a day (BID) | NASAL | 0 refills | Status: AC

## 2022-09-28 NOTE — Progress Notes (Signed)

## 2022-09-28 NOTE — Progress Notes (Signed)
I have spent 5 minutes in review of e-visit questionnaire, review and updating patient chart, medical decision making and response to patient.   Byanca Kasper Cody Keyia Moretto, PA-C    

## 2023-03-25 ENCOUNTER — Other Ambulatory Visit: Payer: Self-pay

## 2023-03-25 ENCOUNTER — Inpatient Hospital Stay (HOSPITAL_COMMUNITY): Payer: Medicaid Other | Admitting: Anesthesiology

## 2023-03-25 ENCOUNTER — Encounter (HOSPITAL_BASED_OUTPATIENT_CLINIC_OR_DEPARTMENT_OTHER): Payer: Self-pay | Admitting: Emergency Medicine

## 2023-03-25 ENCOUNTER — Inpatient Hospital Stay (HOSPITAL_BASED_OUTPATIENT_CLINIC_OR_DEPARTMENT_OTHER)
Admission: EM | Admit: 2023-03-25 | Discharge: 2023-03-27 | DRG: 807 | Disposition: A | Discharge status: Home / Self Care | Payer: Medicaid Other | Attending: Obstetrics and Gynecology | Admitting: Obstetrics and Gynecology

## 2023-03-25 ENCOUNTER — Inpatient Hospital Stay (HOSPITAL_COMMUNITY): Payer: Medicaid Other

## 2023-03-25 DIAGNOSIS — O0933 Supervision of pregnancy with insufficient antenatal care, third trimester: Secondary | ICD-10-CM

## 2023-03-25 DIAGNOSIS — Z6791 Unspecified blood type, Rh negative: Secondary | ICD-10-CM

## 2023-03-25 DIAGNOSIS — O26899 Other specified pregnancy related conditions, unspecified trimester: Principal | ICD-10-CM

## 2023-03-25 DIAGNOSIS — Z833 Family history of diabetes mellitus: Secondary | ICD-10-CM

## 2023-03-25 DIAGNOSIS — Z8249 Family history of ischemic heart disease and other diseases of the circulatory system: Secondary | ICD-10-CM | POA: Diagnosis not present

## 2023-03-25 DIAGNOSIS — Z23 Encounter for immunization: Secondary | ICD-10-CM | POA: Diagnosis not present

## 2023-03-25 DIAGNOSIS — Z3A39 39 weeks gestation of pregnancy: Secondary | ICD-10-CM

## 2023-03-25 DIAGNOSIS — O26893 Other specified pregnancy related conditions, third trimester: Secondary | ICD-10-CM | POA: Diagnosis present

## 2023-03-25 DIAGNOSIS — O9902 Anemia complicating childbirth: Secondary | ICD-10-CM | POA: Diagnosis present

## 2023-03-25 DIAGNOSIS — O09823 Supervision of pregnancy with history of in utero procedure during previous pregnancy, third trimester: Secondary | ICD-10-CM | POA: Diagnosis not present

## 2023-03-25 LAB — OB RESULTS CONSOLE RPR: RPR: NONREACTIVE

## 2023-03-25 LAB — CBC
HCT: 28.2 % — ABNORMAL LOW (ref 36.0–46.0)
Hemoglobin: 8 g/dL — ABNORMAL LOW (ref 12.0–15.0)
MCH: 17.4 pg — ABNORMAL LOW (ref 26.0–34.0)
MCHC: 28.4 g/dL — ABNORMAL LOW (ref 30.0–36.0)
MCV: 61.3 fL — ABNORMAL LOW (ref 80.0–100.0)
Platelets: 409 10*3/uL — ABNORMAL HIGH (ref 150–400)
RBC: 4.6 MIL/uL (ref 3.87–5.11)
RDW: 19.8 % — ABNORMAL HIGH (ref 11.5–15.5)
WBC: 11.4 10*3/uL — ABNORMAL HIGH (ref 4.0–10.5)
nRBC: 0.7 % — ABNORMAL HIGH (ref 0.0–0.2)

## 2023-03-25 LAB — OB RESULTS CONSOLE HIV ANTIBODY (ROUTINE TESTING)
HIV: NONREACTIVE
HIV: NONREACTIVE

## 2023-03-25 LAB — COMPREHENSIVE METABOLIC PANEL
ALT: 12 U/L (ref 0–44)
AST: 15 U/L (ref 15–41)
Albumin: 2.7 g/dL — ABNORMAL LOW (ref 3.5–5.0)
Alkaline Phosphatase: 148 U/L — ABNORMAL HIGH (ref 38–126)
Anion gap: 11 (ref 5–15)
BUN: 6 mg/dL (ref 6–20)
CO2: 17 mmol/L — ABNORMAL LOW (ref 22–32)
Calcium: 8.4 mg/dL — ABNORMAL LOW (ref 8.9–10.3)
Chloride: 106 mmol/L (ref 98–111)
Creatinine, Ser: 0.45 mg/dL (ref 0.44–1.00)
GFR, Estimated: >60 mL/min (ref >60)
Glucose, Bld: 98 mg/dL (ref 70–99)
Potassium: 3.4 mmol/L — ABNORMAL LOW (ref 3.5–5.1)
Sodium: 134 mmol/L — ABNORMAL LOW (ref 135–145)
Total Bilirubin: 0.3 mg/dL (ref 0.3–1.2)
Total Protein: 7 g/dL (ref 6.5–8.1)

## 2023-03-25 LAB — URINALYSIS, MICROSCOPIC (REFLEX): RBC / HPF: >50 RBC/hpf (ref 0–5)

## 2023-03-25 LAB — RPR: RPR Ser Ql: NONREACTIVE

## 2023-03-25 LAB — GROUP B STREP BY PCR: Group B strep by PCR: NEGATIVE

## 2023-03-25 LAB — HEPATITIS C ANTIBODY: HCV Ab: NONREACTIVE

## 2023-03-25 LAB — RAPID URINE DRUG SCREEN, HOSP PERFORMED
Amphetamines: NOT DETECTED
Barbiturates: NOT DETECTED
Benzodiazepines: NOT DETECTED
Cocaine: NOT DETECTED
Opiates: NOT DETECTED
Tetrahydrocannabinol: NOT DETECTED

## 2023-03-25 LAB — HIV ANTIBODY (ROUTINE TESTING W REFLEX): HIV Screen 4th Generation wRfx: NONREACTIVE

## 2023-03-25 LAB — HCG, SERUM, QUALITATIVE: Preg, Serum: POSITIVE — AB

## 2023-03-25 LAB — URINALYSIS, ROUTINE W REFLEX MICROSCOPIC
Glucose, UA: NEGATIVE mg/dL
Ketones, ur: 15 mg/dL — AB
Nitrite: NEGATIVE
Protein, ur: NEGATIVE mg/dL
Specific Gravity, Urine: >1.03 — ABNORMAL HIGH (ref 1.005–1.030)
pH: 6 (ref 5.0–8.0)

## 2023-03-25 LAB — PREGNANCY, URINE: Preg Test, Ur: POSITIVE — AB

## 2023-03-25 LAB — HCG, QUANTITATIVE, PREGNANCY: hCG, Beta Chain, Quant, S: 16936 m[IU]/mL — ABNORMAL HIGH (ref <5)

## 2023-03-25 LAB — HEPATITIS B SURFACE ANTIGEN: Hepatitis B Surface Ag: NONREACTIVE

## 2023-03-25 LAB — LIPASE, BLOOD: Lipase: 36 U/L (ref 11–51)

## 2023-03-25 MED ORDER — OXYTOCIN-SODIUM CHLORIDE 30-0.9 UT/500ML-% IV SOLN
2.5000 [IU]/h | INTRAVENOUS | Status: DC
  Filled 2023-03-25: qty 500

## 2023-03-25 MED ORDER — SOD CITRATE-CITRIC ACID 500-334 MG/5ML PO SOLN: 30.0000 mL | ORAL | Status: DC | PRN

## 2023-03-25 MED ORDER — WITCH HAZEL-GLYCERIN EX PADS: 1.0000 | MEDICATED_PAD | CUTANEOUS | Status: DC | PRN

## 2023-03-25 MED ORDER — FENTANYL-BUPIVACAINE-NACL 0.5-0.125-0.9 MG/250ML-% EP SOLN
12.0000 mL/h | EPIDURAL | Status: DC | PRN
  Administered 2023-03-25: 12 mL/h via EPIDURAL
  Filled 2023-03-25: qty 250

## 2023-03-25 MED ORDER — COCONUT OIL OIL: 1.0000 | TOPICAL_OIL | Status: DC | PRN

## 2023-03-25 MED ORDER — DIPHENHYDRAMINE HCL 25 MG PO CAPS: 25.0000 mg | ORAL_CAPSULE | Freq: Four times a day (QID) | ORAL | Status: DC | PRN

## 2023-03-25 MED ORDER — FENTANYL CITRATE (PF) 100 MCG/2ML IJ SOLN
50.0000 ug | INTRAMUSCULAR | Status: DC | PRN
  Administered 2023-03-25: 100 ug via INTRAVENOUS
  Filled 2023-03-25: qty 2

## 2023-03-25 MED ORDER — ACETAMINOPHEN 325 MG PO TABS: 650.0000 mg | ORAL_TABLET | ORAL | Status: DC | PRN

## 2023-03-25 MED ORDER — OXYTOCIN BOLUS FROM INFUSION
333.0000 mL | Freq: Once | INTRAVENOUS | Status: AC
  Administered 2023-03-25: 333 mL via INTRAVENOUS

## 2023-03-25 MED ORDER — PENICILLIN G POT IN DEXTROSE 60000 UNIT/ML IV SOLN: 3.0000 10*6.[IU] | INTRAVENOUS | Status: DC

## 2023-03-25 MED ORDER — SIMETHICONE 80 MG PO CHEW: 80.0000 mg | CHEWABLE_TABLET | ORAL | Status: DC | PRN

## 2023-03-25 MED ORDER — SENNOSIDES-DOCUSATE SODIUM 8.6-50 MG PO TABS
2.0000 | ORAL_TABLET | ORAL | Status: DC
  Administered 2023-03-26: 2 via ORAL
  Filled 2023-03-25 (×2): qty 2

## 2023-03-25 MED ORDER — LIDOCAINE-EPINEPHRINE (PF) 1.5 %-1:200000 IJ SOLN
INTRAMUSCULAR | Status: DC | PRN
  Administered 2023-03-25: 5 mL via EPIDURAL

## 2023-03-25 MED ORDER — PHENYLEPHRINE 80 MCG/ML (10ML) SYRINGE FOR IV PUSH (FOR BLOOD PRESSURE SUPPORT): 80.0000 ug | PREFILLED_SYRINGE | INTRAVENOUS | Status: DC | PRN

## 2023-03-25 MED ORDER — IBUPROFEN 600 MG PO TABS
600.0000 mg | ORAL_TABLET | Freq: Four times a day (QID) | ORAL | Status: DC
  Administered 2023-03-25 – 2023-03-27 (×8): 600 mg via ORAL
  Filled 2023-03-25 (×9): qty 1

## 2023-03-25 MED ORDER — PRENATAL MULTIVITAMIN CH
1.0000 | ORAL_TABLET | Freq: Every day | ORAL | Status: DC
  Administered 2023-03-26 – 2023-03-27 (×2): 1 via ORAL
  Filled 2023-03-25 (×2): qty 1

## 2023-03-25 MED ORDER — BENZOCAINE-MENTHOL 20-0.5 % EX AERO
1.0000 | INHALATION_SPRAY | CUTANEOUS | Status: DC | PRN
  Filled 2023-03-25: qty 56

## 2023-03-25 MED ORDER — ONDANSETRON HCL 4 MG/2ML IJ SOLN: 4.0000 mg | Freq: Four times a day (QID) | INTRAMUSCULAR | Status: DC | PRN

## 2023-03-25 MED ORDER — ZOLPIDEM TARTRATE 5 MG PO TABS: 5.0000 mg | ORAL_TABLET | Freq: Every evening | ORAL | Status: DC | PRN

## 2023-03-25 MED ORDER — SODIUM CHLORIDE 0.9 % IV SOLN: 1.0000 g | INTRAVENOUS | Status: DC

## 2023-03-25 MED ORDER — SODIUM CHLORIDE 0.9 % IV SOLN: 5.0000 10*6.[IU] | Freq: Once | INTRAVENOUS | Status: DC

## 2023-03-25 MED ORDER — EPHEDRINE 5 MG/ML INJ: 10.0000 mg | INTRAVENOUS | Status: DC | PRN

## 2023-03-25 MED ORDER — SODIUM CHLORIDE 0.9 % IV BOLUS: 1000.0000 mL | Freq: Once | INTRAVENOUS | Status: DC

## 2023-03-25 MED ORDER — LIDOCAINE HCL (PF) 1 % IJ SOLN: 30.0000 mL | INTRAMUSCULAR | Status: DC | PRN

## 2023-03-25 MED ORDER — TETANUS-DIPHTH-ACELL PERTUSSIS 5-2.5-18.5 LF-MCG/0.5 IM SUSY: 0.5000 mL | PREFILLED_SYRINGE | Freq: Once | INTRAMUSCULAR | Status: DC

## 2023-03-25 MED ORDER — ONDANSETRON HCL 4 MG PO TABS: 4.0000 mg | ORAL_TABLET | ORAL | Status: DC | PRN

## 2023-03-25 MED ORDER — MEASLES, MUMPS & RUBELLA VAC IJ SOLR: 0.5000 mL | Freq: Once | INTRAMUSCULAR | Status: DC

## 2023-03-25 MED ORDER — RHO D IMMUNE GLOBULIN 1500 UNIT/2ML IJ SOSY
300.0000 ug | PREFILLED_SYRINGE | Freq: Once | INTRAMUSCULAR | Status: AC
  Administered 2023-03-26: 300 ug via INTRAVENOUS
  Filled 2023-03-25: qty 2

## 2023-03-25 MED ORDER — LACTATED RINGERS IV SOLN: INTRAVENOUS | Status: DC

## 2023-03-25 MED ORDER — DIBUCAINE (PERIANAL) 1 % EX OINT: 1.0000 | TOPICAL_OINTMENT | CUTANEOUS | Status: DC | PRN

## 2023-03-25 MED ORDER — DIPHENHYDRAMINE HCL 50 MG/ML IJ SOLN: 12.5000 mg | INTRAMUSCULAR | Status: DC | PRN

## 2023-03-25 MED ORDER — ONDANSETRON HCL 4 MG/2ML IJ SOLN: 4.0000 mg | INTRAMUSCULAR | Status: DC | PRN

## 2023-03-25 MED ORDER — SODIUM CHLORIDE 0.9 % IV SOLN
2.0000 g | Freq: Once | INTRAVENOUS | Status: AC
  Administered 2023-03-25: 2 g via INTRAVENOUS
  Filled 2023-03-25: qty 2000

## 2023-03-25 MED ORDER — DOCUSATE SODIUM 100 MG PO CAPS: 100.0000 mg | ORAL_CAPSULE | Freq: Two times a day (BID) | ORAL | Status: DC

## 2023-03-25 MED ORDER — LACTATED RINGERS IV SOLN: 500.0000 mL | INTRAVENOUS | Status: DC | PRN

## 2023-03-25 MED ORDER — LACTATED RINGERS IV SOLN
500.0000 mL | Freq: Once | INTRAVENOUS | Status: AC
  Administered 2023-03-25: 500 mL via INTRAVENOUS

## 2023-03-25 NOTE — ED Notes (Signed)
[facility] called for transport. 

## 2023-03-25 NOTE — ED Provider Notes (Signed)
[place] [facility] Provider Note   CSN: [id number] Arrival date & time: 03/25/23  0128     History  Chief Complaint  Patient presents with   Laboring    Susan Bean is a 27 y.o. female.   Abdominal Pain Susan Bean is a 27 y.o. female who presents to the Emergency Department complaining of contractions.  Presents to the emergency department for evaluation of right side pain that feels like contractions.  She states that her LMP was 2 months ago.  She has 2 prior pregnancies.  The pain this evening felt like contractions.  No additional symptoms.  She has no known medical problems. Reports anemia with prior pregnancy requiring blood transfusion.  Also reports a history of shoulder dystocia.     Home Medications Prior to Admission medications   Medication Sig Start Date End Date Taking? Authorizing Provider  benzonatate (TESSALON) 100 MG capsule Take 1 capsule (100 mg total) by mouth 3 (three) times daily as needed. 06/20/22   Margaretann Loveless, PA-C  ipratropium (ATROVENT) 0.03 % nasal spray Place 2 sprays into both nostrils every 12 (twelve) hours. 09/28/22   Waldon Merl, PA-C      Allergies    Patient has no known allergies.    Review of Systems   Review of Systems  Gastrointestinal:  Positive for abdominal pain.  All other systems reviewed and are negative.   Physical Exam Updated Vital Signs BP 117/76 (BP Location: Right Arm)   Pulse 88   Temp 98.1 F (36.7 C) (Oral)   Resp 16   Ht 5\' 8"  (1.727 m)   Wt 95.3 kg   SpO2 100%   BMI 31.95 kg/m  Physical Exam Vitals and nursing note reviewed.  Constitutional:      Appearance: She is well-developed.  HENT:     Head: Normocephalic and atraumatic.  Cardiovascular:     Rate and Rhythm: Normal rate and regular rhythm.     Heart sounds: No murmur heard. Pulmonary:     Effort: Pulmonary effort is normal. No respiratory distress.     Breath sounds: Normal  breath sounds.  Abdominal:     Tenderness: There is no guarding or rebound.     Comments: Gravid uterus.  Genitourinary:    Comments: Small amount of blood and mucus in the vaginal region.  Cervix is high, soft.  Unable to assess for dilation. Musculoskeletal:        General: No tenderness.  Skin:    General: Skin is warm and dry.  Neurological:     Mental Status: She is alert and oriented to person, place, and time.  Psychiatric:        Behavior: Behavior normal.     ED Results / Procedures / Treatments   Labs (all labs ordered are listed, but only abnormal results are displayed) Labs Reviewed  COMPREHENSIVE METABOLIC PANEL - Abnormal; Notable for the following components:      Result Value   Sodium 134 (*)    Potassium 3.4 (*)    CO2 17 (*)    Calcium 8.4 (*)    Albumin 2.7 (*)    Alkaline Phosphatase 148 (*)    All other components within normal limits  CBC - Abnormal; Notable for the following components:   WBC 11.4 (*)    Hemoglobin 8.0 (*)    HCT 28.2 (*)    MCV 61.3 (*)    MCH 17.4 (*)  MCHC 28.4 (*)    RDW 19.8 (*)    Platelets 409 (*)    nRBC 0.7 (*)    All other components within normal limits  HCG, SERUM, QUALITATIVE - Abnormal; Notable for the following components:   Preg, Serum POSITIVE (*)    All other components within normal limits  HCG, QUANTITATIVE, PREGNANCY - Abnormal; Notable for the following components:   hCG, Beta Chain, Quant, S 16,936 (*)    All other components within normal limits  GROUP B STREP BY PCR  LIPASE, BLOOD  URINALYSIS, ROUTINE W REFLEX MICROSCOPIC  PREGNANCY, URINE  RPR  HIV ANTIBODY (ROUTINE TESTING W REFLEX)  RUBELLA SCREEN  HEPATITIS B SURFACE ANTIGEN  HEMOGLOBIN A1C  HEPATITIS C ANTIBODY  RAPID URINE DRUG SCREEN, HOSP PERFORMED  TYPE AND SCREEN    EKG None  Radiology No results found.  Procedures Procedures   EMERGENCY DEPARTMENT [place] PREGNANCY "Study: Limited Ultrasound of the Pelvis for  Pregnancy"  INDICATIONS:Pregnancy(required) and Vaginal bleeding Multiple views of the uterus and pelvic cavity were obtained in real-time with a multi-frequency probe.  APPROACH:Transabdominal  PERFORMED BY: Myself IMAGES ARCHIVED?: No LIMITATIONS: Emergent procedure PREGNANCY FREE FLUID: None ADNEXAL FINDINGS:Left ovary not seen and Right ovary not seen GESTATIONAL AGE, ESTIMATE: [redacted]w[redacted]d by biparietal diameter FETAL HEART RATE: 150 INTERPRETATION: Fetal heart activity seen  CRITICAL CARE Performed by: Tilden Fossa   Total critical care time: 90 minutes  Critical care time was exclusive of separately billable procedures and treating other patients.  Critical care was necessary to treat or prevent imminent or life-threatening deterioration.  Critical care was time spent personally by me on the following activities: development of treatment plan with patient and/or surrogate as well as nursing, discussions with consultants, evaluation of patient's response to treatment, examination of patient, obtaining history from patient or surrogate, ordering and performing treatments and interventions, ordering and review of laboratory studies, ordering and review of radiographic studies, pulse oximetry and re-evaluation of patient's condition.    Medications Ordered in ED Medications  sodium chloride 0.9 % bolus 1,000 mL (has no administration in time range)  lactated ringers infusion ( Intravenous New Bag/Given 03/25/23 0611)  oxytocin (PITOCIN) IV BOLUS FROM BAG (has no administration in time range)  oxytocin (PITOCIN) IV infusion 30 units in NS 500 mL - Premix (has no administration in time range)  lactated ringers infusion 500-1,000 mL (has no administration in time range)  acetaminophen (TYLENOL) tablet 650 mg (has no administration in time range)  fentaNYL (SUBLIMAZE) injection 50-100 mcg (100 mcg Intravenous Given 03/25/23 0605)  ondansetron (ZOFRAN) injection 4 mg (has no  administration in time range)  sodium citrate-citric acid (ORACIT) solution 30 mL (has no administration in time range)  lidocaine (PF) (XYLOCAINE) 1 % injection 30 mL (has no administration in time range)  ampicillin (OMNIPEN) 2 g in sodium chloride 0.9 % 100 mL IVPB (2 g Intravenous New Bag/Given 03/25/23 1610)    Followed by  ampicillin (OMNIPEN) 1 g in sodium chloride 0.9 % 100 mL IVPB (has no administration in time range)  ePHEDrine injection 10 mg (has no administration in time range)  PHENYLephrine 80 mcg/ml in normal saline Adult IV Push Syringe (For Blood Pressure Support) (has no administration in time range)  lactated ringers infusion 500 mL (has no administration in time range)  fentaNYL 2 mcg/mL w/ bupivacaine 0.125% in NS 250 mL epidural infusion (has no administration in time range)  diphenhydrAMINE (BENADRYL) injection 12.5 mg (has no administration in time range)  ePHEDrine injection 10 mg (has no administration in time range)  PHENYLephrine 80 mcg/ml in normal saline Adult IV Push Syringe (For Blood Pressure Support) (has no administration in time range)    ED Course/ Medical Decision Making/ A&P                                 Medical Decision Making Amount and/or Complexity of Data Reviewed Labs: ordered.  Risk Decision regarding hospitalization.   Patient here for evaluation of abdominal pain, greatest on the right side that feels like labor pains.  She is unaware of current pregnancy.  She has had 2 prior pregnancies.  On examination there is a gravid uterus, bedside ultrasound demonstrates fetal cardiac activity a rate of 150.  Head is down with estimated biparietal diameter of 35 weeks.  Patient was transferred to resuscitation room to be placed on tocometry.  She has been experiencing contractions.  She was started on IV fluids.  Discussed with Dr. Charlotta Newton with MAU.  Plan to transfer to labor and delivery for ongoing care for active labor. Sterile vaginal examination  with small amount of blood, unable to palpate fetal head, indeterminant cervical dilation.         Final Clinical Impression(s) / ED Diagnoses Final diagnoses:  Abdominal pain during pregnancy, antepartum    Rx / DC Orders ED Discharge Orders     None         Tilden Fossa, MD 03/25/23 [phone]

## 2023-03-25 NOTE — Discharge Summary (Signed)
Postpartum Discharge Summary  Date of Service updated-9/17     Patient Name: Susan Bean DOB: [birth year] MRN: [id number]  Date of admission: 03/25/2023 Delivery date:03/25/2023 Delivering provider: Moody Bruins Date of discharge: 03/27/2023  Admitting diagnosis: Indication for care or intervention in labor or delivery [O75.9] Intrauterine pregnancy: [redacted]w[redacted]d     Secondary diagnosis:  Principal Problem:   Indication for care or intervention in labor or delivery  Additional problems: No prenatal care    Discharge diagnosis: Term Pregnancy Delivered                                              Post partum procedures: none Augmentation: N/A Complications: None  Hospital course: Onset of Labor With Vaginal Delivery      27 y.o. yo G3P2003 at [redacted]w[redacted]d was admitted in Active Labor on 03/25/2023. Labor course was uncomplicated.  Membrane Rupture Time/Date: 5:20 AM,03/25/2023  Delivery Method:Vaginal, Spontaneous Operative Delivery:N/A Episiotomy: None Lacerations:  1st degree Patient had a postpartum course is uncomplicated.  She is ambulating, tolerating a regular diet, passing flatus, and urinating well. Patient is discharged home in stable condition on 03/27/23.  Newborn Data: Birth date:03/25/2023 Birth time:12:52 PM Gender:Female Living status:Living Apgars:8 ,9  Weight:3771 g  Magnesium Sulfate received: No BMZ received: No Rhophylac:No MMR:No T-DaP: No Flu: N/A Transfusion:No  Physical exam  Vitals:   03/26/23 0516 03/26/23 1500 03/26/23 2046 03/27/23 0543  BP: 107/78 130/83 107/64 102/68  Pulse: 72 (!) 115 81 71  Resp: 18 18 18 18   Temp: 97.7 F (36.5 C) 98.2 F (36.8 C) 98.3 F (36.8 C) 97.7 F (36.5 C)  TempSrc: Oral Oral Oral Oral  SpO2:      Weight:      Height:       General: alert, cooperative, and no distress Lochia: appropriate Uterine Fundus: firm Incision: N/A DVT Evaluation: No evidence of DVT seen on physical exam. Labs: Lab Results   Component Value Date   WBC 11.4 (H) 03/25/2023   HGB 8.0 (L) 03/25/2023   HCT 28.2 (L) 03/25/2023   MCV 61.3 (L) 03/25/2023   PLT 409 (H) 03/25/2023      Latest Ref Rng & Units 03/25/2023    1:37 AM  CMP  Glucose 70 - 99 mg/dL 98   BUN 6 - 20 mg/dL 6   Creatinine 1.60 - 1.09 mg/dL 3.23   Sodium 557 - 322 mmol/L 134   Potassium 3.5 - 5.1 mmol/L 3.4   Chloride 98 - 111 mmol/L 106   CO2 22 - 32 mmol/L 17   Calcium 8.9 - 10.3 mg/dL 8.4   Total Protein 6.5 - 8.1 g/dL 7.0   Total Bilirubin 0.3 - 1.2 mg/dL 0.3   Alkaline Phos 38 - 126 U/L 148   AST 15 - 41 U/L 15   ALT 0 - 44 U/L 12    Edinburgh Score:    03/26/2023    5:13 AM  Edinburgh Postnatal Depression Scale Screening Tool  I have been able to laugh and see the funny side of things. 0  I have looked forward with enjoyment to things. 0  I have blamed myself unnecessarily when things went wrong. 0  I have been anxious or worried for no good reason. 0  I have felt scared or panicky for no good reason. 0  Things have been getting on top  of me. 0  I have been so unhappy that I have had difficulty sleeping. 0  I have felt sad or miserable. 0  I have been so unhappy that I have been crying. 0  The thought of harming myself has occurred to me. 0  Edinburgh Postnatal Depression Scale Total 0     After visit meds:  Allergies as of 03/27/2023   No Known Allergies      Medication List     TAKE these medications    acetaminophen 325 MG tablet Commonly known as: Tylenol Take 2 tablets (650 mg total) by mouth every 4 (four) hours as needed (for pain scale < 4).   benzonatate 100 MG capsule Commonly known as: TESSALON Take 1 capsule (100 mg total) by mouth 3 (three) times daily as needed.   ibuprofen 600 MG tablet Commonly known as: ADVIL Take 1 tablet (600 mg total) by mouth every 6 (six) hours.   ipratropium 0.03 % nasal spray Commonly known as: ATROVENT Place 2 sprays into both nostrils every 12 (twelve) hours.    norethindrone 0.35 MG tablet Commonly known as: Ortho Micronor Take 1 tablet (0.35 mg total) by mouth daily.         Discharge home in stable condition Infant Feeding: Bottle Infant Disposition:home with mother Discharge instruction: per After Visit Summary and Postpartum booklet. Activity: Advance as tolerated. Pelvic rest for 6 weeks.  Diet: routine diet Future Appointments:No future appointments. Follow up Visit:  Follow-up Information     [facility]. Call in 5 week(s).   Specialty: Obstetrics and Gynecology Why: Please make a 5-6wk postpartum visit Contact information: [address] [place] [address] [phone]               Message sent to [facility] on 9/15  Please schedule this patient for a In person postpartum visit in 6 weeks with the following provider: Any provider. Additional Postpartum F/U:Postpartum Depression checkup  Low risk pregnancy complicated by:  No prenatal care Delivery mode:  Vaginal, Spontaneous Anticipated Birth Control:  POPs   03/27/2023 Sharon Seller, DO

## 2023-03-25 NOTE — MAU Note (Signed)
.  Susan Bean is a 27 y.o. at Unknown here in MAU reporting: arrival via EMS from [facility] with "contraction pain".  Pt reports that she has not felt baby movement as she did not know she was pregnant.  Pt denies VB and LOF.    Onset of complaint: 1200 Pain score: 6 Vitals:   03/25/23 0315 03/25/23 0319  BP: 111/76   Pulse: 85   Resp:  18  Temp:    SpO2: 100%      FHT:125  Lab orders placed from triage:    none

## 2023-03-25 NOTE — H&P (Signed)
Susan Bean is a 27 y.o. female presenting for active labor.  Patient have received no prenatal care during this pregnancy.  Reports that she was not aware that she was pregnant.  Patient pregnancy history significant for 2 previous vaginal deliveries, with the first complicated by shoulder dystocia.  Of note infant weight for the first delivery was 3864 g.  Patient also with history of GBS during pregnancy.  OB History     Gravida  3   Para  2   Term  2   Preterm      AB      Living  1      SAB      IAB      Ectopic      Multiple  0   Live Births  1          Past Medical History:  Diagnosis Date   Anemia    Medical history non-contributory    Past Surgical History:  Procedure Laterality Date   NO PAST SURGERIES     Family History: family history includes Arthritis in her maternal uncle and mother; Cancer in her mother; Diabetes in her maternal aunt; Hypertension in her maternal aunt and maternal uncle. Social History:  reports that she has never smoked. She has never used smokeless tobacco. She reports that she does not drink alcohol and does not use drugs.     Maternal Diabetes: Not assessed, HgB A1C Pending Genetic Screening: Declined Maternal Ultrasounds/Referrals: Other: Limited Late third trimester  Fetal Ultrasounds or other Referrals:  None Maternal Substance Abuse:  No Significant Maternal Medications:  None Significant Maternal Lab Results:  Other: GBS unknown Number of Prenatal Visits:Less than or equal to 3 verified prenatal visits Other Comments:  None  Review of Systems  Gastrointestinal:  Positive for abdominal pain (Contractions). Negative for constipation, diarrhea, nausea and vomiting.  Genitourinary:  Positive for vaginal bleeding. Negative for difficulty urinating, dysuria and vaginal discharge.  Neurological:  Negative for dizziness, light-headedness and headaches.   Maternal Medical History:  Reason for admission: Contractions  and vaginal bleeding.  Nausea.  Contractions: Onset was 6-12 hours ago.   Frequency: regular.   Perceived severity is mild.   Fetal activity: Perceived fetal activity is normal.   Last perceived fetal movement was within the past hour.   Prenatal complications: No care Prenatal Complications - Diabetes: Unknown status  Dilation: 4 Effacement (%): 50 Exam by:: Gerrit Heck, CNM Blood pressure 122/72, pulse 78, temperature 97.8 F (36.6 C), temperature source Oral, resp. rate 18, height 5\' 8"  (1.727 m), weight 95.3 kg, SpO2 100%, unknown if currently breastfeeding. Maternal Exam:  Uterine Assessment: Contraction strength is moderate.  Abdomen: Patient reports no abdominal tenderness. Fundal height is 41.   Estimated fetal weight is 8 pounds 6 ounces.   Fetal presentation: vertex Introitus: Normal vulva. Ferning test: not done.  Nitrazine test: not done. Amniotic fluid character: clear. Pelvis: adequate for delivery.   Cervix: Cervix evaluated by digital exam.     Fetal Exam Fetal Monitor Review: Mode: ultrasound.   Baseline rate: 125.  Variability: moderate (6-25 bpm).   Pattern: accelerations present and no decelerations.   Fetal State Assessment: Category I - tracings are normal.   Physical Exam Vitals reviewed. Exam conducted with a chaperone present Addison Naegeli, RN).  Constitutional:      Appearance: Normal appearance.  HENT:     Head: Normocephalic and atraumatic.  Eyes:     Conjunctiva/sclera: Conjunctivae normal.  Cardiovascular:  Rate and Rhythm: Normal rate.  Pulmonary:     Effort: Pulmonary effort is normal. No respiratory distress.  Genitourinary:    General: Normal vulva.  Musculoskeletal:        General: Normal range of motion.  Skin:    General: Skin is warm and dry.  Neurological:     Mental Status: She is alert and oriented to person, place, and time.  Psychiatric:        Mood and Affect: Mood normal.        Behavior: Behavior normal.      Prenatal labs: ABO, Rh:  O Negative Antibody:  Pending Rubella:  Pending RPR:   Pending HBsAg:   Pending HIV:   Pending GBS:   Unknown  Assessment/Plan: 27 year old, G3 P2-0-0-2 S IUP at 39.4 weeks No prenatal care History of shoulder dystocia  Admit to [employer]  Routine Labor and Delivery Orders per Protocol Plan for UDS and SW consult PCN for Unknown GBS  Desires circumcision if female Desires birth control, but unsure of method. Plans to bottle feed.   Cherre Robins 03/25/2023, 4:53 AM  Addendum 5:20 AM  -Patient reports gush of fluid. -Provider to bedside. -Patient appears to be ruptured with clear fluid noted. -L&D team updated.  Cherre Robins MSN, CNM Advanced Practice Provider, Center for [employer]

## 2023-03-25 NOTE — Progress Notes (Signed)
Received call from [facility] regarding patient with complaint of contractions and VB.  G3P2 unsure of dates as did not realize she was pregnant.  Will remotely monitor.

## 2023-03-25 NOTE — Anesthesia Preprocedure Evaluation (Addendum)
Anesthesia Evaluation  Patient identified by MRN, date of birth, ID band Patient awake    Reviewed: Allergy & Precautions, NPO status , Patient's Chart, lab work & pertinent test results  Airway Mallampati: II  TM Distance: >3 FB Neck ROM: Full    Dental no notable dental hx.    Pulmonary neg pulmonary ROS   Pulmonary exam normal        Cardiovascular negative cardio ROS  Rhythm:Regular Rate:Normal     Neuro/Psych negative neurological ROS  negative psych ROS   GI/Hepatic negative GI ROS, Neg liver ROS,,,  Endo/Other  negative endocrine ROS    Renal/GU negative Renal ROS  negative genitourinary   Musculoskeletal negative musculoskeletal ROS (+)    Abdominal Normal abdominal exam  (+)   Peds  Hematology  (+) Blood dyscrasia, anemia Lab Results      Component                Value               Date                      WBC                      11.4 (H)            03/25/2023                HGB                      8.0 (L)             03/25/2023                HCT                      28.2 (L)            03/25/2023                MCV                      61.3 (L)            03/25/2023                PLT                      409 (H)             03/25/2023              Anesthesia Other Findings   Reproductive/Obstetrics (+) Pregnancy                             Anesthesia Physical Anesthesia Plan  ASA: 2  Anesthesia Plan: Epidural   Post-op Pain Management:    Induction:   PONV Risk Score and Plan: 2 and Treatment may vary due to age or medical condition  Airway Management Planned: Natural Airway  Additional Equipment: None  Intra-op Plan:   Post-operative Plan:   Informed Consent: I have reviewed the patients History and Physical, chart, labs and discussed the procedure including the risks, benefits and alternatives for the proposed anesthesia with the patient or authorized  representative who has indicated his/her understanding and acceptance.     Dental advisory given  Plan Discussed with:  Anesthesia Plan Comments:        Anesthesia Quick Evaluation

## 2023-03-25 NOTE — ED Triage Notes (Signed)
Pt reports "labor pain" that started this morning. LMP 2 months ago. Pt states she has not taken a pregnancy test, but that "I know what that feels like". Reports pain is intermittent. Mentions having a fall Saturday morning and hitting the R side of her back. Also mentions some vaginal bleeding.

## 2023-03-25 NOTE — ED Notes (Signed)
[facility] Meriam Sprague) notified of patient on monitor for OB reporting contractions with unknown dates of pregnancy. FHT 140s-150s.

## 2023-03-25 NOTE — Plan of Care (Signed)

## 2023-03-25 NOTE — ED Notes (Signed)
EDP contacting OB for update.

## 2023-03-25 NOTE — Progress Notes (Signed)
Received call from [facility] and notified that they have spoken with Dr Charlotta Newton and will be transferring patient to MAU for further evaluation.

## 2023-03-26 LAB — RUBELLA SCREEN: Rubella: 1.12 {index} (ref >0.99)

## 2023-03-26 LAB — HEMOGLOBIN A1C
Hgb A1c MFr Bld: 5.8 % — ABNORMAL HIGH (ref 4.8–5.6)
Mean Plasma Glucose: 120 mg/dL

## 2023-03-26 NOTE — Progress Notes (Signed)
POSTPARTUM PROGRESS NOTE  Post Partum Day 1  Subjective:  Susan Bean is a 27 y.o. G3P2003 s/p SVD at [redacted]w[redacted]d.  She reports she is doing well. No acute events overnight. She denies any problems with ambulating, voiding or po intake. Denies nausea or vomiting.  Pain is well controlled.  Lochia is = menses.  Objective: Blood pressure 107/78, pulse 72, temperature 97.7 F (36.5 C), temperature source Oral, resp. rate 18, height 5\' 8"  (1.727 m), weight 95.3 kg, SpO2 100%, unknown if currently breastfeeding.  Physical Exam:  General: alert, cooperative and no distress Chest: no respiratory distress Heart:regular rate, distal pulses intact Uterine Fundus: firm, appropriately tender DVT Evaluation: No calf swelling or tenderness Extremities: trace edema Skin: warm, dry  Recent Labs    03/25/23 0137  HGB 8.0*  HCT 28.2*    Assessment/Plan: Susan Bean is a 27 y.o. G3P2003 s/p SVD at [redacted]w[redacted]d   PPD#1 - Doing well  Routine postpartum care Rh neg  Infant Rh positive, will need Rhogam before d/c  Contraception: unsure Feeding: bottle Dispo: Plan for discharge 9/17.   LOS: 1 day   Hessie Dibble, MD OB Fellow  03/26/2023, 6:51 AM

## 2023-03-26 NOTE — Anesthesia Postprocedure Evaluation (Signed)
Anesthesia Post Note  Patient: Susan Bean  Procedure(s) Performed: AN AD HOC LABOR EPIDURAL     Patient location during evaluation: Mother Baby Anesthesia Type: Epidural Level of consciousness: awake, oriented and awake and alert Pain management: pain level controlled Vital Signs Assessment: post-procedure vital signs reviewed and stable Respiratory status: spontaneous breathing, respiratory function stable and nonlabored ventilation Cardiovascular status: stable Postop Assessment: no headache, adequate PO intake, able to ambulate, patient able to bend at knees and no apparent nausea or vomiting Anesthetic complications: no   No notable events documented.  Last Vitals:  Vitals:   03/25/23 2357 03/26/23 0516  BP: 124/73 107/78  Pulse: 74 72  Resp: 18 18  Temp: 36.8 C 36.5 C  SpO2:      Last Pain:  Vitals:   03/26/23 0516  TempSrc: Oral  PainSc:    Pain Goal:                   Kaylise Blakeley

## 2023-03-27 LAB — RH IG WORKUP (INCLUDES ABO/RH)
Fetal Screen: NEGATIVE
Gestational Age(Wks): 39
Unit division: 0

## 2023-03-27 MED ORDER — ACETAMINOPHEN 325 MG PO TABS: 650.0000 mg | ORAL_TABLET | ORAL | Status: AC | PRN

## 2023-03-27 MED ORDER — NORETHINDRONE 0.35 MG PO TABS: 1.0000 | ORAL_TABLET | Freq: Every day | ORAL | 4 refills | Status: AC

## 2023-03-27 MED ORDER — IBUPROFEN 600 MG PO TABS: 600.0000 mg | ORAL_TABLET | Freq: Four times a day (QID) | ORAL | 0 refills | Status: AC

## 2023-03-27 NOTE — Lactation Note (Signed)
This note was copied from a baby's chart. Lactation Consultation Note  Patient Name: Susan Bean [id number] Date: 03/27/2023 Age:27 hours Reason for consult: Initial assessment, 1st time breastfeeding  P3, Called to room because mother has decided to try breastfeeding because baby has not been consuming appropriate volumes of formula with bottle.  Last feeding 5 ml. Mother thought maybe baby would like breastmilk better.  LC evaluated suck with gloved finger.  Noted suck was weak, disorganized and baby easily gagged.    Reviewed hand expression with good drops expressed bilaterally.  Placed baby in football hold with gently stroking of lips to interest baby in feeding. Baby stared,did not cue nor open to latch.    Attempted feeding baby with Gold Nfant nipple both in upright and side lying positions.  Baby was tongue thrusting, pushing bottle out of his mouth.  Did not grasp nipple.  Mother states she will pump with DEBP later.  Recommend pumping q 3 hours for 15 min both breasts.  Provided milk storage guidelines and cleaning set up.    RN attempted feeding infant with bottle.    Maternal Data Has patient been taught Hand Expression?: Yes Does the patient have breastfeeding experience prior to this delivery?: No  Feeding Mother's Current Feeding Choice: Breast Milk and Formula Nipple Type: Nfant Extra Slow Flow (gold) Lactation Tools Discussed/Used Tools: Pump Breast pump type: Double-Electric Breast Pump Pump Education: Milk Storage Reason for Pumping: Mother's choice Pumping frequency: Recommend q 3 hours  Interventions Interventions: Assisted with latch;Hand express;Education;DEBP  Consult Status Consult Status: PRN    Hardie Pulley  RN IBCLC 03/27/2023, 3:20 PM

## 2023-03-27 NOTE — Clinical Social Work Maternal (Signed)
CLINICAL SOCIAL WORK MATERNAL/CHILD NOTE  Patient Details  Name: Susan Bean MRN: [id number] Date of Birth: 1995/11/23  Date:  03/27/2023  Clinical Social Worker Initiating Note:  Susan Bean Date/Time: Initiated:  03/27/23/1222     Child's Name:  Susan Bean   Biological Parents:  Mother Susan Bean February 16, 1996)   Need for Interpreter:  None   Reason for Referral:  Late or No Prenatal Care     Address:  [address] [place] [address]    Phone number:  [phone] (home)     Additional phone number:   Household Members/Support Persons (HM/SP):   Household Member/Support Person 1, Household Member/Support Person 2   HM/SP Name Relationship DOB or Age  HM/SP -1 Susan Bean son 05/03/2019  HM/SP -2 Susan Bean son 10/04/2020  HM/SP -3        HM/SP -4        HM/SP -5        HM/SP -6        HM/SP -7        HM/SP -8          Natural Supports (not living in the home):  Extended Family   Professional Supports: None   Employment: Full-time   Type of Work: [employer] insurance   Education:  [employer] school graduate   Homebound arranged:    Surveyor, quantity Resources:  Medicaid   Other Resources:      Cultural/Religious Considerations Which May Impact Care:    Strengths:  Ability to meet basic needs  , Home prepared for child  , Pediatrician chosen   Psychotropic Medications:         Pediatrician:    Fish farm manager area  Pediatrician List:   [employer] Other ([facility])  [facility]      Pediatrician Fax Number:    Risk Factors/Current Problems:  None   Cognitive State:  Able to Concentrate  , Alert     Mood/Affect:  Calm  , Comfortable     CSW Assessment: CSW received consult for lack of PNC. CSW entered the room and observed MOB holding the infant and maternal aunt at bedside. CSW introduced self, CSW role and reason for visit, MOB was agreeable to visit and  allowed Maternal aunt to remain in the room. CSW inquired about how MOB was feeling, MOB reported good. CSW inquired about hx MH or PPD. MOB denied any MH or PPD hx. CSW provided education regarding the baby blues period vs. perinatal mood disorders, discussed treatment and gave resources for mental health follow up if concerns arise.  CSW recommends self-evaluation during the postpartum time period using the New Mom Checklist from Postpartum Progress and encouraged MOB to contact a medical professional if symptoms are noted at any time. MOB identified her aunt, sister and brother as her main supports.   CSW inquired about MOB lack of prenatal care, MOB stated "I did not know I was pregnant". MOB reported she was unaware she was pregnancy until she started feeling contractions. CSW explained the hospital drug screen policy due to lack of [facility] although she was unaware of the pregnancy. MOB verbalized understanding. CSW informed MOB infants UDS was negative but the CDS was pending, and a COS report would be made inf warranted. MOB denied the use of any substances and denied CPS history.      CSW provided  review of Sudden Infant Death Syndrome (SIDS) precautions.  MOB reported she has all necessary items for the infant including a bassinet and car seat.  CSW identifies no further need for intervention and no barriers to discharge at this time.  CSW Plan/Description:  No Further Intervention Required/No Barriers to Discharge, Sudden Infant Death Syndrome (SIDS) Education, Perinatal Mood and Anxiety Disorder (PMADs) Education, Hospital Drug Screen Policy Information, CSW Will Continue to Monitor Umbilical Cord Tissue Drug Screen Results and Make Report if Susan Salaam, LCSW 03/27/2023, 12:28 PM

## 2023-03-28 ENCOUNTER — Ambulatory Visit (HOSPITAL_COMMUNITY): Payer: Self-pay

## 2023-03-28 LAB — BPAM RBC
Blood Product Expiration Date: 202409302359
Blood Product Expiration Date: 202410022359
Unit Type and Rh: 9500
Unit Type and Rh: 9500

## 2023-03-28 LAB — TYPE AND SCREEN
ABO/RH(D): O NEG
Antibody Screen: NEGATIVE
Unit division: 0
Unit division: 0

## 2023-03-28 NOTE — Lactation Note (Signed)
This note was copied from a baby's chart. Lactation Consultation Note  Patient Name: Susan Bean [id number] Date: 03/28/2023 Age:27 hours    LC attempted to visit with recently discharged P3 Mom of term baby transferred to NICU due to poor feedings.  RN states that Mom is formula feeding.  LC yesterday had consulted as Mom had wanted to pump to help baby feed better on EBM.  Pump was set up and Mom said she would pump later.   This LC is unsure if Mom is still wanting to pump.  Will F/U later as currently Mom had gone home after discharge.   Susan Bean 03/28/2023, 5:01 PM

## 2023-04-09 NOTE — MAU Provider Note (Signed)
Information provided by Gerrit Heck, CNM.  Pt was admitted following her MAU assessment:      Maternal Diabetes: Not assessed, HgB A1C Pending Genetic Screening: Declined Maternal Ultrasounds/Referrals: Other: Limited Late third trimester  Fetal Ultrasounds or other Referrals:  None Maternal Substance Abuse:  No Significant Maternal Medications:  None Significant Maternal Lab Results:  Other: GBS unknown Number of Prenatal Visits:Less than or equal to 3 verified prenatal visits Other Comments:  None   Review of Systems  Gastrointestinal:  Positive for abdominal pain (Contractions). Negative for constipation, diarrhea, nausea and vomiting.  Genitourinary:  Positive for vaginal bleeding. Negative for difficulty urinating, dysuria and vaginal discharge.  Neurological:  Negative for dizziness, light-headedness and headaches.    Maternal Medical History:  Reason for admission: Contractions and vaginal bleeding.  Nausea.   Contractions: Onset was 6-12 hours ago.   Frequency: regular.   Perceived severity is mild.   Fetal activity: Perceived fetal activity is normal.   Last perceived fetal movement was within the past hour.   Prenatal complications: No care Prenatal Complications - Diabetes: Unknown status   Dilation: 4 Effacement (%): 50 Exam by:: Gerrit Heck, CNM Blood pressure 122/72, pulse 78, temperature 97.8 F (36.6 C), temperature source Oral, resp. rate 18, height 5\' 8"  (1.727 m), weight 95.3 kg, SpO2 100%, unknown if currently breastfeeding. Maternal Exam:  Uterine Assessment: Contraction strength is moderate.  Abdomen: Patient reports no abdominal tenderness. Fundal height is 41.   Estimated fetal weight is 8 pounds 6 ounces.   Fetal presentation: vertex Introitus: Normal vulva. Ferning test: not done.  Nitrazine test: not done. Amniotic fluid character: clear. Pelvis: adequate for delivery.   Cervix: Cervix evaluated by digital exam.       Fetal Exam Fetal  Monitor Review: Mode: ultrasound.   Baseline rate: 125.  Variability: moderate (6-25 bpm).   Pattern: accelerations present and no decelerations.   Fetal State Assessment: Category I - tracings are normal.     Physical Exam Vitals reviewed. Exam conducted with a chaperone present Addison Naegeli, RN).  Constitutional:      Appearance: Normal appearance.  HENT:     Head: Normocephalic and atraumatic.  Eyes:     Conjunctiva/sclera: Conjunctivae normal.  Cardiovascular:     Rate and Rhythm: Normal rate.  Pulmonary:     Effort: Pulmonary effort is normal. No respiratory distress.  Genitourinary:    General: Normal vulva.  Musculoskeletal:        General: Normal range of motion.  Skin:    General: Skin is warm and dry.  Neurological:     Mental Status: She is alert and oriented to person, place, and time.  Psychiatric:        Mood and Affect: Mood normal.        Behavior: Behavior normal.      Prenatal labs: ABO, Rh:  O Negative Antibody:  Pending Rubella:  Pending RPR:   Pending HBsAg:   Pending HIV:   Pending GBS:   Unknown   Assessment/Plan: 27 year old, G3 P2-0-0-2 S IUP at 39.4 weeks No prenatal care History of shoulder dystocia   Admit to [facility]  Routine Labor and Delivery Orders per Protocol Plan for UDS and SW consult PCN for Unknown GBS  Desires circumcision if female Desires birth control, but unsure of method. Plans to bottle feed.     Cherre Robins 03/25/2023, 4:53 AM   Addendum 5:20 AM   -Patient reports gush of fluid. -Provider to bedside. -Patient appears  to be ruptured with clear fluid noted. -L&D team updated.   Cherre Robins MSN, CNM Advanced Practice Provider, Center for [facility]    Attestation of Attending Supervision of Advanced Practice Provider (CNM/NP/PA): Evaluation and management procedures were performed by the Advanced Practice Provider under my supervision and collaboration.  I have reviewed the Advanced  Practice Provider's note and chart, and I agree with the management and plan. I have also made any necessary editorial changes.   Sharon Seller, DO Attending Obstetrician & Gynecologist, [facility] for [facility], [facility]

## 2023-04-23 ENCOUNTER — Telehealth (HOSPITAL_COMMUNITY): Payer: Self-pay | Admitting: *Deleted

## 2023-04-23 NOTE — Telephone Encounter (Signed)
04/23/2023  Name: Susan Bean MRN: [id number] DOB: 1996-04-11  Reason for Call:  Transition of Care Hospital Discharge Call  Contact Status: Patient Contact Status: Complete  Language assistant needed: Interpreter Mode: Interpreter Not Needed        Follow-Up Questions: Do You Have Any Concerns About Your Health As You Heal From Delivery?: No Do You Have Any Concerns About Your Infants Health?: Infant in NICU (at [facility])  Edinburgh Postnatal Depression Scale:  In the Past 7 Days:    PHQ2-9 Depression Scale:     Discharge Follow-up: Edinburgh score requires follow up?:  (declines screening today, took with nurse at [facility] 2 days ago and had a low score with no concerns, endorses that she is doing well emotionally at this time) Patient was advised of the following resources:: Breastfeeding Support Group, Support Group (declines postpartum group information via email)  Post-discharge interventions: NA  Salena Saner, RN 04/23/2023 10:20

## 2023-05-10 ENCOUNTER — Ambulatory Visit: Payer: Medicaid Other | Admitting: Obstetrics and Gynecology

## 2023-12-29 ENCOUNTER — Emergency Department (HOSPITAL_BASED_OUTPATIENT_CLINIC_OR_DEPARTMENT_OTHER)
Admission: EM | Admit: 2023-12-29 | Discharge: 2023-12-29 | Disposition: A | Discharge status: Home / Self Care | Attending: Emergency Medicine | Admitting: Emergency Medicine

## 2023-12-29 ENCOUNTER — Other Ambulatory Visit: Payer: Self-pay

## 2023-12-29 ENCOUNTER — Encounter (HOSPITAL_BASED_OUTPATIENT_CLINIC_OR_DEPARTMENT_OTHER): Payer: Self-pay | Admitting: Urology

## 2023-12-29 DIAGNOSIS — K0889 Other specified disorders of teeth and supporting structures: Secondary | ICD-10-CM | POA: Insufficient documentation

## 2023-12-29 MED ORDER — AMOXICILLIN-POT CLAVULANATE 875-125 MG PO TABS: 1.0000 | ORAL_TABLET | Freq: Two times a day (BID) | ORAL | 0 refills | Status: AC

## 2023-12-29 MED ORDER — BENZOCAINE 10 % MT GEL
Freq: Once | OROMUCOSAL | Status: DC
Start: 2023-12-29 — End: 2023-12-29

## 2023-12-29 MED ORDER — CELECOXIB 200 MG PO CAPS: 200.0000 mg | ORAL_CAPSULE | Freq: Two times a day (BID) | ORAL | 0 refills | Status: DC | PRN

## 2023-12-29 MED ORDER — CELECOXIB 200 MG PO CAPS: 200.0000 mg | ORAL_CAPSULE | Freq: Two times a day (BID) | ORAL | 0 refills | Status: AC | PRN

## 2023-12-29 MED ORDER — AMOXICILLIN-POT CLAVULANATE 875-125 MG PO TABS: 1.0000 | ORAL_TABLET | Freq: Two times a day (BID) | ORAL | 0 refills | Status: DC

## 2023-12-29 NOTE — ED Provider Notes (Signed)
 [facility] Provider Note   CSN: [id number] Arrival date & time: 12/29/23  1159     Patient presents with: Dental Pain   Susan Bean is a 28 y.o. female.    Dental Pain   28 year old female presents emergency department complaints of dental pain.  Reports pain in her right lower wisdom tooth.  States that she has been dealing with pain for the past several months intermittently.  Has followed with dentistry who has planned tooth extraction in August.  States that she woke up yesterday with worsening pain that persisted to today prompting visit to the emergency department.  Denies any floor of mouth swelling, tongue swelling, difficulty breathing/swallowing, feelings of throat closing on her.  Has taken no medication for her symptoms.  Presents emergency department for further assessment/evaluation.  Past medical history significant for anemia  Prior to Admission medications   Medication Sig Start Date End Date Taking? Authorizing Provider  acetaminophen  (TYLENOL ) 325 MG tablet Take 2 tablets (650 mg total) by mouth every 4 (four) hours as needed (for pain scale < 4). 03/27/23   Ozan, Jennifer, DO  amoxicillin-clavulanate (AUGMENTIN) 875-125 MG tablet Take 1 tablet by mouth every 12 (twelve) hours. 12/29/23   Silver Wonda LABOR, PA  benzonatate  (TESSALON ) 100 MG capsule Take 1 capsule (100 mg total) by mouth 3 (three) times daily as needed. 06/20/22   Vivienne Delon HERO, PA-C  celecoxib (CELEBREX) 200 MG capsule Take 1 capsule (200 mg total) by mouth 2 (two) times daily as needed. 12/29/23   Silver Wonda LABOR, PA  ibuprofen  (ADVIL ) 600 MG tablet Take 1 tablet (600 mg total) by mouth every 6 (six) hours. 03/27/23   Ozan, Jennifer, DO  ipratropium (ATROVENT ) 0.03 % nasal spray Place 2 sprays into both nostrils every 12 (twelve) hours. 09/28/22   Gladis Elsie BROCKS, PA-C  norethindrone  (ORTHO MICRONOR ) 0.35 MG tablet Take 1 tablet (0.35 mg total)  by mouth daily. 03/27/23 06/25/23  Ozan, Jennifer, DO    Allergies: Patient has no known allergies.    Review of Systems  All other systems reviewed and are negative.   Updated Vital Signs BP (!) 130/93 (BP Location: Left Arm)   Pulse 79   Temp 97.7 F (36.5 C)   Resp 18   Ht 5' 8 (1.727 m)   Wt 95.3 kg   SpO2 99%   BMI 31.95 kg/m   Physical Exam Vitals and nursing note reviewed.  Constitutional:      General: She is not in acute distress.    Appearance: She is well-developed.  HENT:     Head: Normocephalic and atraumatic.     Mouth/Throat:      Comments: Patient with tenderness along gingiva adjacent to tooth as above.  No evidence clinically of periapical abscess.  No sublingual or submandibular swelling.  No trismus.  No changes in phonation.  Uvula midline rises symmetrically phonation.  Tonsils 1+ bilaterally without exudate.  Caries present in affected tooth.  Eyes:     Conjunctiva/sclera: Conjunctivae normal.    Cardiovascular:     Rate and Rhythm: Normal rate and regular rhythm.     Heart sounds: No murmur heard. Pulmonary:     Effort: Pulmonary effort is normal. No respiratory distress.     Breath sounds: Normal breath sounds.  Abdominal:     Palpations: Abdomen is soft.     Tenderness: There is no abdominal tenderness.   Musculoskeletal:  General: No swelling.     Cervical back: Neck supple.   Skin:    General: Skin is warm and dry.     Capillary Refill: Capillary refill takes less than 2 seconds.   Neurological:     Mental Status: She is alert.   Psychiatric:        Mood and Affect: Mood normal.     (all labs ordered are listed, but only abnormal results are displayed) Labs Reviewed - No data to display  EKG: None  Radiology: No results found.   Procedures   Medications Ordered in the ED  benzocaine  (ORAJEL) 10 % mucosal gel ( Mouth/Throat Not Given 12/29/23 1226)                                    Medical Decision  Making Risk OTC drugs. Prescription drug management.   This patient presents to the ED for concern of dental pain, this involves an extensive number of treatment options, and is a complaint that carries with it a high risk of complications and morbidity.  The differential diagnosis includes periapical abscess, necrotizing ulcerative gingivitis, Ludwig angina, cellulitis,   Co morbidities that complicate the patient evaluation  See HPI   Additional history obtained:  Additional history obtained from EMR External records from outside source obtained and reviewed including hospital records   Lab Tests:  N/a   Imaging Studies ordered:  N/a   Cardiac Monitoring: / EKG:  N/a   Consultations Obtained:  N/a   Problem List / ED Course / Critical interventions / Medication management  Dental pain Reevaluation of the patient showed that the patient stayed the same I have reviewed the patients home medicines and have made adjustments as needed   Social Determinants of Health:  Denies tobacco illicit drug use.   Test / Admission - Considered:  Dental pain Vitals signs within normal range and stable throughout visit. 28 year old female presents emergency department complaints of dental pain.  Reports pain in her right lower wisdom tooth.  States that she has been dealing with pain for the past several months intermittently.  Has followed with dentistry who has planned tooth extraction in August.  States that she woke up yesterday with worsening pain that persisted to today prompting visit to the emergency department.  Denies any floor of mouth swelling, tongue swelling, difficulty breathing/swallowing, feelings of throat closing on her.  Has taken no medication for her symptoms.  Presents emergency department for further assessment/evaluation. On exam, right lower molar with caries present as above.  Gingival tenderness.  No evidence clinically of Ludwig angina, periapical  abscess.  Will recommend adequate oral hygiene at home and empirically place patient on antibiotics given concern for bacterial infectious process as cause of worsening patient's pain.  Will recommend follow-up with dentistry in the outpatient setting.  Treatment plan discussed with patient and she understands agreeable to said plan.  Patient well-appearing, afebrile in no acute distress. Worrisome signs and symptoms were discussed with the patient, and the patient acknowledged understanding to return to the ED if noticed. Patient was stable upon discharge.       Final diagnoses:  Pain, dental    ED Discharge Orders          Ordered    amoxicillin-clavulanate (AUGMENTIN) 875-125 MG tablet  Every 12 hours,   Status:  Discontinued        12/29/23 1209  celecoxib (CELEBREX) 200 MG capsule  2 times daily PRN,   Status:  Discontinued        12/29/23 1209    amoxicillin-clavulanate (AUGMENTIN) 875-125 MG tablet  Every 12 hours        12/29/23 1213    celecoxib (CELEBREX) 200 MG capsule  2 times daily PRN        12/29/23 1213               Silver Wonda LABOR, [place] 12/29/23 1233    Dreama Longs, MD 12/29/23 1702

## 2023-12-29 NOTE — ED Triage Notes (Signed)
 Right lower dental pain that started yesterday  States throbbing pain

## 2023-12-29 NOTE — Discharge Instructions (Signed)
 As discussed, will place you on antibiotics for treatment of infection.  Also send in anti-inflammatories to treat pain.  Recommend follow-up with dental specialist in the outpatient setting to have affected tooth addressed.
